# Patient Record
Sex: Male | Born: 1940 | Race: Black or African American | Hispanic: No | Marital: Married | Smoking: Former smoker
Health system: Southern US, Community
[De-identification: ages and names within clinical notes are randomized; demographics above are authoritative.]

## PROBLEM LIST (undated history)

## (undated) DIAGNOSIS — E7800 Pure hypercholesterolemia, unspecified: Secondary | ICD-10-CM

## (undated) DIAGNOSIS — Z8679 Personal history of other diseases of the circulatory system: Secondary | ICD-10-CM

## (undated) DIAGNOSIS — J45909 Unspecified asthma, uncomplicated: Secondary | ICD-10-CM

## (undated) DIAGNOSIS — H409 Unspecified glaucoma: Secondary | ICD-10-CM

## (undated) DIAGNOSIS — E785 Hyperlipidemia, unspecified: Secondary | ICD-10-CM

## (undated) DIAGNOSIS — C61 Malignant neoplasm of prostate: Secondary | ICD-10-CM

## (undated) DIAGNOSIS — R351 Nocturia: Secondary | ICD-10-CM

## (undated) DIAGNOSIS — J449 Chronic obstructive pulmonary disease, unspecified: Secondary | ICD-10-CM

## (undated) DIAGNOSIS — Z973 Presence of spectacles and contact lenses: Secondary | ICD-10-CM

## (undated) DIAGNOSIS — M1990 Unspecified osteoarthritis, unspecified site: Secondary | ICD-10-CM

## (undated) HISTORY — PX: CERVICAL SPINE SURGERY: SHX589

## (undated) HISTORY — PX: PROSTATE BIOPSY: SHX241

## (undated) HISTORY — PX: CATARACT EXTRACTION W/ INTRAOCULAR LENS IMPLANT: SHX1309

## (undated) HISTORY — PX: NECK SURGERY: SHX720

---

## 2005-07-29 ENCOUNTER — Ambulatory Visit: Payer: Self-pay | Admitting: Family Medicine

## 2005-09-25 ENCOUNTER — Ambulatory Visit: Payer: Self-pay | Admitting: Family Medicine

## 2005-10-06 ENCOUNTER — Ambulatory Visit: Payer: Self-pay | Admitting: Family Medicine

## 2006-02-04 DIAGNOSIS — F528 Other sexual dysfunction not due to a substance or known physiological condition: Secondary | ICD-10-CM

## 2006-03-04 ENCOUNTER — Ambulatory Visit: Payer: Self-pay | Admitting: Family Medicine

## 2007-09-04 ENCOUNTER — Emergency Department (HOSPITAL_COMMUNITY): Admission: EM | Admit: 2007-09-04 | Discharge: 2007-09-04 | Payer: Self-pay | Admitting: Emergency Medicine

## 2010-05-24 NOTE — Assessment & Plan Note (Signed)
[facility]                          [place] OFFICE NOTE   NAME:Timothy Evans, Timothy Evans                       MRN:          [id number]  DATE:07/29/2005                            DOB:          1940-07-01    REASON FOR VISIT:  Establish.   HISTORY OF PRESENT ILLNESS:  Mr. Mccluney is a 70 year old male who reports he  has not seen a doctor in 5+ years, presenting today to establish care.  He  does have several concerns.  He complains of daily congestion and sneezing.  He has noticed increased sneezing when he is around dust.  He denies any  fevers, chills, or coughing.  He denies any previous history of allergies.  The patient is a smoker.   The patient is also concerned about smoking.  He states that he smokes 1/4  pack a day.  He states that his breakfast consist of a cup of coffee and  cigarettes.  He would like to improve his diet and is willing to stop  smoking, but feels that he needs help.   Mr. Guile is also traveling through [place] and [place] in the next month and  needs to have his immunizations updated.   Mr. Geister has noticed trouble keeping and getting an erection.  He states  that his sex drive has not changed.  He has never tried any medications for  this symptom.  He denies any trauma or any history of sexually transmitted  disease.  He is in a monogamous relationship with his wife.   PAST MEDICAL HISTORY:  None.   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   MEDICATIONS:  None.   ALLERGIES:  No known drug allergies.   FAMILY HISTORY:  Father was killed in the war in [place].  Mother is alive  with multiple medical problems which the patient is not aware of.  He has 10  brothers and sisters who are alive and well.   SOCIAL HISTORY:  The patient works in the Systems [employer].  He  is married with three children.  He smokes 1/4 pack a day and drinks beer  occasionally.   REVIEW OF SYSTEMS:  Unremarkable except for what is noted in the HPI.   OBJECTIVE:  VITAL SIGNS:  Blood pressure 118/70, pulse of 84, weight of 157.  GENERAL:  A pleasant male in no acute distress.  He answers questions  appropriately.  HEENT:  Nasal mucosa was boggy, swollen turbinates with clear nasal  discharge.  There was cobblestone appearance in the right nostril.  Oropharynx was benign.  NECK:  Supple, no lymphadenopathy, carotid bruits, or JVD.  No thyromegaly  was noted.  LUNGS:  Clear.  HEART:  Regular rate and rhythm, normal S1 and S2, no murmurs, rubs, or  gallops.  EXTREMITIES:  No cyanosis, clubbing, or edema.   IMPRESSION:  1.  Chronic rhinitis.  2.  Erectile dysfunction.  3.  Smoker.   PLAN:  1.  In regards to his rhinitis, we will start the patient on Nasonex two  squirts in each nostril daily and he will follow up in four to six weeks      to let us know if this will help.  2.  In regards to his erectile dysfunction, we will obtain a testosterone      level to rule out a hormone imbalance.  I did provide samples of Cialis,      i.e., five.  Discussed the side effects and directions.  3.  Long discussion regarding smoking cessation was done.  The patient      agreed on treatment.  I provided a prescription for Chantix.  Side      effects were reviewed.  The patient will follow up with me after he      returns from [place].  I also provided him with the 1-800-quit-now card.  4.  In regards to his traveling, I will provide him with the [facility] phone number and address.                                   Leanne Chang, MD   LA/MedQ  DD:  07/29/2005  DT:  07/29/2005  Job #:  [id number]

## 2014-01-16 ENCOUNTER — Emergency Department (HOSPITAL_COMMUNITY)
Admission: EM | Admit: 2014-01-16 | Discharge: 2014-01-16 | Disposition: A | Discharge status: Home / Self Care | Payer: PPO | Attending: Emergency Medicine | Admitting: Emergency Medicine

## 2014-01-16 ENCOUNTER — Encounter (HOSPITAL_COMMUNITY): Payer: Self-pay | Admitting: Emergency Medicine

## 2014-01-16 DIAGNOSIS — E780 Pure hypercholesterolemia: Secondary | ICD-10-CM | POA: Insufficient documentation

## 2014-01-16 DIAGNOSIS — Z79899 Other long term (current) drug therapy: Secondary | ICD-10-CM | POA: Insufficient documentation

## 2014-01-16 DIAGNOSIS — G542 Cervical root disorders, not elsewhere classified: Secondary | ICD-10-CM | POA: Diagnosis not present

## 2014-01-16 DIAGNOSIS — Z87891 Personal history of nicotine dependence: Secondary | ICD-10-CM | POA: Insufficient documentation

## 2014-01-16 DIAGNOSIS — M25511 Pain in right shoulder: Secondary | ICD-10-CM | POA: Diagnosis present

## 2014-01-16 HISTORY — DX: Pure hypercholesterolemia, unspecified: E78.00

## 2014-01-16 MED ORDER — DIAZEPAM 10 MG PO TABS: 10.0000 mg | ORAL_TABLET | Freq: Three times a day (TID) | ORAL | Status: DC | PRN

## 2014-01-16 MED ORDER — PREDNISONE 20 MG PO TABS: 20.0000 mg | ORAL_TABLET | Freq: Two times a day (BID) | ORAL | Status: DC

## 2014-01-16 NOTE — Discharge Instructions (Signed)
Use heat on the sore area 3 or 4 times a day. Take Tylenol every 4 hours as needed for pain.    Cervical Radiculopathy Cervical radiculopathy happens when a nerve in the neck is pinched or bruised by a slipped (herniated) disk or by arthritic changes in the bones of the cervical spine. This can occur due to an injury or as part of the normal aging process. Pressure on the cervical nerves can cause pain or numbness that runs from your neck all the way down into your arm and fingers. CAUSES  There are many possible causes, including:  Injury.  Muscle tightness in the neck from overuse.  Swollen, painful joints (arthritis).  Breakdown or degeneration in the bones and joints of the spine (spondylosis) due to aging.  Bone spurs that may develop near the cervical nerves. SYMPTOMS  Symptoms include pain, weakness, or numbness in the affected arm and hand. Pain can be severe or irritating. Symptoms may be worse when extending or turning the neck. DIAGNOSIS  Your caregiver will ask about your symptoms and do a physical exam. He or she may test your strength and reflexes. X-rays, CT scans, and MRI scans may be needed in cases of injury or if the symptoms do not go away after a period of time. Electromyography (EMG) or nerve conduction testing may be done to study how your nerves and muscles are working. TREATMENT  Your caregiver may recommend certain exercises to help relieve your symptoms. Cervical radiculopathy can, and often does, get better with time and treatment. If your problems continue, treatment options may include:  Wearing a soft collar for short periods of time.  Physical therapy to strengthen the neck muscles.  Medicines, such as nonsteroidal anti-inflammatory drugs (NSAIDs), oral corticosteroids, or spinal injections.  Surgery. Different types of surgery may be done depending on the cause of your problems. HOME CARE INSTRUCTIONS   Put ice on the affected area.  Put ice in a  plastic bag.  Place a towel between your skin and the bag.  Leave the ice on for 15-20 minutes, 03-04 times a day or as directed by your caregiver.  If ice does not help, you can try using heat. Take a warm shower or bath, or use a hot water bottle as directed by your caregiver.  You may try a gentle neck and shoulder massage.  Use a flat pillow when you sleep.  Only take over-the-counter or prescription medicines for pain, discomfort, or fever as directed by your caregiver.  If physical therapy was prescribed, follow your caregiver's directions.  If a soft collar was prescribed, use it as directed. SEEK IMMEDIATE MEDICAL CARE IF:   Your pain gets much worse and cannot be controlled with medicines.  You have weakness or numbness in your hand, arm, face, or leg.  You have a high fever or a stiff, rigid neck.  You lose bowel or bladder control (incontinence).  You have trouble with walking, balance, or speaking. MAKE SURE YOU:   Understand these instructions.  Will watch your condition.  Will get help right away if you are not doing well or get worse. Document Released: 09/17/2000 Document Revised: 03/17/2011 Document Reviewed: 08/06/2010 [facility] Patient Information 2015 [place], [place]. This information is not intended to replace advice given to you by your health care provider. Make sure you discuss any questions you have with your health care provider.

## 2014-01-16 NOTE — ED Notes (Signed)
Pt c/o right shoulder pain that started yesterday afternoon. Pt states that he was at work yesterday when it started hurting but denies any injury. Pt states that he does a lot of walking at work.  Pt states that his mother is up on 6th floor and while he was up there with her he was having a lot of discomfort, se figured he would come be seen.

## 2014-01-16 NOTE — ED Provider Notes (Signed)
CSN: [id number]     Arrival date & time 01/16/14  1033 History   First MD Initiated Contact with Patient 01/16/14 1123     Chief Complaint  Patient presents with  . Shoulder Pain     (Consider location/radiation/quality/duration/timing/severity/associated sxs/prior Treatment) HPI   Timothy Evans is a 74 y.o. male who is here for evaluation of right neck and shoulder pain.  Problem occurred several days ago and is aggravating, bothering him with movement, and when sleeping.  No recent trauma.  History of neck surgery for "pinched nerve, remotely.  He denies fever, chills, nausea, vomiting, weakness or dizziness.  There are no other known modifying factors.   Past Medical History  Diagnosis Date  . High cholesterol    Past Surgical History  Procedure Laterality Date  . Neck surgery     No family history on file. History  Substance Use Topics  . Smoking status: Former Research scientist (life sciences)  . Smokeless tobacco: Not on file  . Alcohol Use: Yes     Comment: occasional    Review of Systems  All other systems reviewed and are negative.     Allergies  Review of patient's allergies indicates no known allergies.  Home Medications   Prior to Admission medications   Medication Sig Start Date End Date Taking? Authorizing Provider  ketoconazole (NIZORAL) 2 % shampoo Apply 1 application topically 2 (two) times a week. 01/09/14  Yes Historical Provider, MD  simvastatin (ZOCOR) 10 MG tablet Take 1 tablet by mouth daily. 01/09/14  Yes Historical Provider, MD  Vitamin D, Ergocalciferol, (DRISDOL) 50000 UNITS CAPS capsule Take 1 capsule by mouth once a week. Every thursday 01/11/14  Yes Historical Provider, MD  diazepam (VALIUM) 10 MG tablet Take 1 tablet (10 mg total) by mouth every 8 (eight) hours as needed (Muscle spasm). 01/16/14   Richarda Blade, MD  predniSONE (DELTASONE) 20 MG tablet Take 1 tablet (20 mg total) by mouth 2 (two) times daily. 01/16/14   Richarda Blade, MD   BP 180/98 mmHg  Pulse 88   Temp(Src) 98.1 F (36.7 C) (Oral)  Resp 16  SpO2 98% Physical Exam  Constitutional: He is oriented to person, place, and time. He appears well-developed and well-nourished. No distress.  HENT:  Head: Normocephalic and atraumatic.  Right Ear: External ear normal.  Left Ear: External ear normal.  Eyes: Conjunctivae and EOM are normal. Pupils are equal, round, and reactive to light.  Neck: Normal range of motion and phonation normal. Neck supple.  Cardiovascular: Normal rate, regular rhythm and normal heart sounds.   Pulmonary/Chest: Effort normal and breath sounds normal. He exhibits no bony tenderness.  Abdominal: Soft. There is no tenderness.  Musculoskeletal: Normal range of motion.  Mild tenderness right trapezius, with normal range of motion, neck, shoulders, arms and legs.  Neurological: He is alert and oriented to person, place, and time. No cranial nerve deficit or sensory deficit. He exhibits normal muscle tone. Coordination normal.  Skin: Skin is warm, dry and intact.  Psychiatric: He has a normal mood and affect. His behavior is normal. Judgment and thought content normal.  Nursing note and vitals reviewed.   ED Course  Procedures (including critical care time)  Findings discussed with patient, all questions answered.  Labs Review Labs Reviewed - No data to display  Imaging Review No results found.   EKG Interpretation None      MDM   Final diagnoses:  Cervical neuropathy    Nursing Notes Reviewed/ Care  Coordinated, and agree without changes. Applicable Imaging Reviewed.  Interpretation of Laboratory Data incorporated into ED treatment  Nursing Notes Reviewed/ Care Coordinated Applicable Imaging Reviewed Interpretation of Laboratory Data incorporated into ED treatment  The patient appears reasonably screened and/or stabilized for discharge and I doubt any other medical condition or other [facility] requiring further screening, evaluation, or treatment in the  ED at this time prior to discharge.  Plan: Home Medications- Valium, Prednisone; Home Treatments- rest, Heat; return here if the recommended treatment, does not improve the symptoms; Recommended follow up- PCP prn     Richarda Blade, MD 01/16/14 1731

## 2014-01-26 ENCOUNTER — Inpatient Hospital Stay (HOSPITAL_COMMUNITY)
Admission: EM | Admit: 2014-01-26 | Discharge: 2014-01-29 | DRG: 194 | Disposition: A | Discharge status: Home / Self Care | Payer: PPO | Attending: Internal Medicine | Admitting: Internal Medicine

## 2014-01-26 ENCOUNTER — Encounter (HOSPITAL_COMMUNITY): Payer: Self-pay

## 2014-01-26 ENCOUNTER — Emergency Department (HOSPITAL_COMMUNITY): Payer: PPO

## 2014-01-26 DIAGNOSIS — J154 Pneumonia due to other streptococci: Principal | ICD-10-CM | POA: Diagnosis present

## 2014-01-26 DIAGNOSIS — R05 Cough: Secondary | ICD-10-CM | POA: Diagnosis present

## 2014-01-26 DIAGNOSIS — Z87891 Personal history of nicotine dependence: Secondary | ICD-10-CM

## 2014-01-26 DIAGNOSIS — N179 Acute kidney failure, unspecified: Secondary | ICD-10-CM | POA: Diagnosis present

## 2014-01-26 DIAGNOSIS — E785 Hyperlipidemia, unspecified: Secondary | ICD-10-CM | POA: Diagnosis present

## 2014-01-26 DIAGNOSIS — M779 Enthesopathy, unspecified: Secondary | ICD-10-CM | POA: Diagnosis present

## 2014-01-26 DIAGNOSIS — M19011 Primary osteoarthritis, right shoulder: Secondary | ICD-10-CM | POA: Diagnosis present

## 2014-01-26 DIAGNOSIS — R059 Cough, unspecified: Secondary | ICD-10-CM | POA: Insufficient documentation

## 2014-01-26 DIAGNOSIS — M25511 Pain in right shoulder: Secondary | ICD-10-CM

## 2014-01-26 DIAGNOSIS — R079 Chest pain, unspecified: Secondary | ICD-10-CM

## 2014-01-26 DIAGNOSIS — N289 Disorder of kidney and ureter, unspecified: Secondary | ICD-10-CM

## 2014-01-26 DIAGNOSIS — J189 Pneumonia, unspecified organism: Secondary | ICD-10-CM | POA: Diagnosis present

## 2014-01-26 LAB — CBC WITH DIFFERENTIAL/PLATELET
BASOS ABS: 0 10*3/uL (ref 0.0–0.1)
BASOS PCT: 0 % (ref 0–1)
EOS PCT: 0 % (ref 0–5)
Eosinophils Absolute: 0 10*3/uL (ref 0.0–0.7)
HEMATOCRIT: 48 % (ref 39.0–52.0)
Hemoglobin: 17.1 g/dL — ABNORMAL HIGH (ref 13.0–17.0)
LYMPHS ABS: 0.7 10*3/uL (ref 0.7–4.0)
Lymphocytes Relative: 4 % — ABNORMAL LOW (ref 12–46)
MCH: 31.5 pg (ref 26.0–34.0)
MCHC: 35.6 g/dL (ref 30.0–36.0)
MCV: 88.4 fL (ref 78.0–100.0)
MONO ABS: 0.9 10*3/uL (ref 0.1–1.0)
MONOS PCT: 5 % (ref 3–12)
Neutro Abs: 14.8 10*3/uL — ABNORMAL HIGH (ref 1.7–7.7)
Neutrophils Relative %: 91 % — ABNORMAL HIGH (ref 43–77)
Platelets: 243 10*3/uL (ref 150–400)
RBC: 5.43 MIL/uL (ref 4.22–5.81)
RDW: 14.5 % (ref 11.5–15.5)
WBC: 16.4 10*3/uL — ABNORMAL HIGH (ref 4.0–10.5)

## 2014-01-26 LAB — COMPREHENSIVE METABOLIC PANEL
ALBUMIN: 3.5 g/dL (ref 3.5–5.2)
ALK PHOS: 62 U/L (ref 39–117)
ALT: 37 U/L (ref 0–53)
AST: 46 U/L — ABNORMAL HIGH (ref 0–37)
Anion gap: 14 (ref 5–15)
BILIRUBIN TOTAL: 2.4 mg/dL — AB (ref 0.3–1.2)
BUN: 36 mg/dL — ABNORMAL HIGH (ref 6–23)
CALCIUM: 8.7 mg/dL (ref 8.4–10.5)
CO2: 19 mmol/L (ref 19–32)
CREATININE: 1.76 mg/dL — AB (ref 0.50–1.35)
Chloride: 104 mEq/L (ref 96–112)
GFR calc non Af Amer: 37 mL/min — ABNORMAL LOW (ref >90)
GFR, EST AFRICAN AMERICAN: 43 mL/min — AB (ref >90)
Glucose, Bld: 145 mg/dL — ABNORMAL HIGH (ref 70–99)
POTASSIUM: 4.1 mmol/L (ref 3.5–5.1)
SODIUM: 137 mmol/L (ref 135–145)
TOTAL PROTEIN: 8.3 g/dL (ref 6.0–8.3)

## 2014-01-26 LAB — I-STAT TROPONIN, ED: Troponin i, poc: 0 ng/mL (ref 0.00–0.08)

## 2014-01-26 LAB — I-STAT CG4 LACTIC ACID, ED: Lactic Acid, Venous: 1.78 mmol/L (ref 0.5–2.0)

## 2014-01-26 MED ORDER — AZITHROMYCIN 250 MG PO TABS
500.0000 mg | ORAL_TABLET | Freq: Once | ORAL | Status: AC
  Administered 2014-01-26: 500 mg via ORAL
  Filled 2014-01-26: qty 2

## 2014-01-26 MED ORDER — SODIUM CHLORIDE 0.9 % IV BOLUS (SEPSIS)
1000.0000 mL | Freq: Once | INTRAVENOUS | Status: AC
  Administered 2014-01-26: 1000 mL via INTRAVENOUS

## 2014-01-26 MED ORDER — DEXTROSE 5 % IV SOLN
1.0000 g | Freq: Once | INTRAVENOUS | Status: AC
  Administered 2014-01-26: 1 g via INTRAVENOUS
  Filled 2014-01-26: qty 10

## 2014-01-26 NOTE — ED Notes (Signed)
EKG given to EDP,Knapp,J. For review. 

## 2014-01-26 NOTE — ED Provider Notes (Signed)
CSN: [id number]     Arrival date & time 01/26/14  1932 History   First MD Initiated Contact with Patient 01/26/14 1944     Chief Complaint  Patient presents with  . Weakness  . Cough     (Consider location/radiation/quality/duration/timing/severity/associated sxs/prior Treatment) Patient is a 74 y.o. male presenting with cough. The history is provided by the patient.  Cough Cough characteristics:  Productive Sputum characteristics:  Yellow Severity:  Moderate Onset quality:  Gradual Duration:  5 days Timing:  Constant Progression:  Worsening Chronicity:  New Smoker: no   Context: sick contacts   Context comment:  10 days ago his mother was admitted for pneumonia Relieved by:  Nothing Worsened by:  Nothing tried Ineffective treatments:  None tried Associated symptoms: shortness of breath   Associated symptoms: no chest pain, no fever, no sore throat and no wheezing   Associated symptoms comment:  10 days severe right shoulder pain that's getting worse.  Patient has had decreased appetite and has not eaten in 4 days Risk factors: no recent infection     Past Medical History  Diagnosis Date  . High cholesterol    Past Surgical History  Procedure Laterality Date  . Neck surgery     History reviewed. No pertinent family history. History  Substance Use Topics  . Smoking status: Former Research scientist (life sciences)  . Smokeless tobacco: Not on file  . Alcohol Use: Yes     Comment: occasional    Review of Systems  Constitutional: Negative for fever.  HENT: Negative for sore throat.   Respiratory: Positive for cough and shortness of breath. Negative for wheezing.   Cardiovascular: Negative for chest pain.  All other systems reviewed and are negative.     Allergies  Review of patient's allergies indicates no known allergies.  Home Medications   Prior to Admission medications   Medication Sig Start Date End Date Taking? Authorizing Provider  diazepam (VALIUM) 10 MG tablet Take 1  tablet (10 mg total) by mouth every 8 (eight) hours as needed (Muscle spasm). 01/16/14   Richarda Blade, MD  ketoconazole (NIZORAL) 2 % shampoo Apply 1 application topically 2 (two) times a week. 01/09/14   Historical Provider, MD  predniSONE (DELTASONE) 20 MG tablet Take 1 tablet (20 mg total) by mouth 2 (two) times daily. 01/16/14   Richarda Blade, MD  simvastatin (ZOCOR) 10 MG tablet Take 1 tablet by mouth daily. 01/09/14   Historical Provider, MD  Vitamin D, Ergocalciferol, (DRISDOL) 50000 UNITS CAPS capsule Take 1 capsule by mouth once a week. Every thursday 01/11/14   Historical Provider, MD   BP 105/64 mmHg  Pulse 118  Temp(Src) 97.9 F (36.6 C) (Oral)  Resp 18  Ht 5\' 9"  (1.753 m)  Wt 179 lb (81.194 kg)  BMI 26.42 kg/m2  SpO2 97% Physical Exam  Constitutional: He is oriented to person, place, and time. He appears well-developed and well-nourished. No distress.  HENT:  Head: Normocephalic and atraumatic.  Mouth/Throat: Oropharynx is clear and moist. Mucous membranes are dry.  Eyes: Conjunctivae and EOM are normal. Pupils are equal, round, and reactive to light.  Neck: Normal range of motion. Neck supple.  Cardiovascular: Regular rhythm and intact distal pulses.  Tachycardia present.   No murmur heard. Pulmonary/Chest: Effort normal and breath sounds normal. Tachypnea noted. No respiratory distress. He has no wheezes. He has no rales.  Abdominal: Soft. He exhibits no distension. There is no tenderness. There is no rebound and no guarding.  Musculoskeletal:  Normal range of motion. He exhibits no edema or tenderness.  Neurological: He is alert and oriented to person, place, and time.  Skin: Skin is warm and dry. No rash noted. No erythema.  Psychiatric: He has a normal mood and affect. His behavior is normal.  Nursing note and vitals reviewed.   ED Course  Procedures (including critical care time) Labs Review Labs Reviewed  CBC WITH DIFFERENTIAL - Abnormal; Notable for the  following:    WBC 16.4 (*)    Hemoglobin 17.1 (*)    Neutrophils Relative % 91 (*)    Neutro Abs 14.8 (*)    Lymphocytes Relative 4 (*)    All other components within normal limits  COMPREHENSIVE METABOLIC PANEL - Abnormal; Notable for the following:    Glucose, Bld 145 (*)    BUN 36 (*)    Creatinine, Ser 1.76 (*)    AST 46 (*)    Total Bilirubin 2.4 (*)    GFR calc non Af Amer 37 (*)    GFR calc Af Amer 43 (*)    All other components within normal limits  I-STAT TROPOININ, ED  I-STAT CG4 LACTIC ACID, ED    Imaging Review Dg Chest 2 View  01/26/2014   CLINICAL DATA:  Pt complains of being weak, not eating in 4 days, and a productive cough. Pt also states that his right arm is very weak.  EXAM: CHEST  2 VIEW  COMPARISON:  None  FINDINGS: There is right upper lobe consolidation consistent with pneumonia. Lungs are mildly hyperexpanded. No pleural effusion or pneumothorax.  Heart, mediastinum and hila are unremarkable.  Bony thorax is demineralized but intact.  IMPRESSION: Right upper lobe pneumonia.a   Electronically Signed   By: Lajean Manes M.D.   On: 01/26/2014 20:05     EKG Interpretation   Date/Time:  Thursday January 26 2014 19:50:58 EST Ventricular Rate:  113 PR Interval:  110 QRS Duration: 83 QT Interval:  281 QTC Calculation: 385 R Axis:   38 Text Interpretation:  Sinus tachycardia Probable left atrial enlargement  RSR' in V1 or V2, probably normal variant Borderline repol abnormality,  diffuse leads No previous tracing Confirmed by Maryan Rued  MD, Loree Fee  [phone]) on 01/26/2014 7:58:45 PM      MDM   Final diagnoses:  CAP (community acquired pneumonia)  Acute renal insufficiency    Patient presents with 5 days of worsening cough, decreased appetite and mild shortness of breath. His mother was recently admitted for pneumonia 10 days ago and his symptoms developed 5 days ago.  Patient has also been complaining of right shoulder pain for the last 10 days. He denies  any unilateral leg pain or swelling.  Here patient is tachycardic but does not appear to be tachypnea get this time. He satting 97% on room air. No prior history of asthma.  Chest x-ray shows right upper lobe pneumonia. CBC, CMP, lactate, troponin pending. Patient given IV fluids  10:32 PM Pt with leukocytosis of 16,000 and CMP with new ARI.  Pt given azithro/rocephin and IVF.  Blanchie Dessert, MD 01/26/14 2235

## 2014-01-26 NOTE — ED Notes (Signed)
Pt complains of being weak, not eating in 4 days, and a cough. Pt's mother admitted here for 10days for pneumonia, pt went to [facility] and upon his return developed this cough and weakness, pt also states that his right arm is very weak

## 2014-01-27 ENCOUNTER — Inpatient Hospital Stay (HOSPITAL_COMMUNITY): Payer: PPO

## 2014-01-27 DIAGNOSIS — N289 Disorder of kidney and ureter, unspecified: Secondary | ICD-10-CM

## 2014-01-27 DIAGNOSIS — J189 Pneumonia, unspecified organism: Secondary | ICD-10-CM

## 2014-01-27 DIAGNOSIS — R079 Chest pain, unspecified: Secondary | ICD-10-CM | POA: Insufficient documentation

## 2014-01-27 DIAGNOSIS — E785 Hyperlipidemia, unspecified: Secondary | ICD-10-CM

## 2014-01-27 DIAGNOSIS — M25511 Pain in right shoulder: Secondary | ICD-10-CM

## 2014-01-27 LAB — CBC WITH DIFFERENTIAL/PLATELET
Basophils Absolute: 0 10*3/uL (ref 0.0–0.1)
Basophils Relative: 0 % (ref 0–1)
EOS ABS: 0.1 10*3/uL (ref 0.0–0.7)
EOS PCT: 0 % (ref 0–5)
HEMATOCRIT: 42 % (ref 39.0–52.0)
HEMOGLOBIN: 14.8 g/dL (ref 13.0–17.0)
LYMPHS ABS: 1.7 10*3/uL (ref 0.7–4.0)
Lymphocytes Relative: 10 % — ABNORMAL LOW (ref 12–46)
MCH: 31.2 pg (ref 26.0–34.0)
MCHC: 35.2 g/dL (ref 30.0–36.0)
MCV: 88.4 fL (ref 78.0–100.0)
Monocytes Absolute: 0.9 10*3/uL (ref 0.1–1.0)
Monocytes Relative: 6 % (ref 3–12)
Neutro Abs: 14.3 10*3/uL — ABNORMAL HIGH (ref 1.7–7.7)
Neutrophils Relative %: 84 % — ABNORMAL HIGH (ref 43–77)
Platelets: 247 10*3/uL (ref 150–400)
RBC: 4.75 MIL/uL (ref 4.22–5.81)
RDW: 14.5 % (ref 11.5–15.5)
WBC: 17 10*3/uL — AB (ref 4.0–10.5)

## 2014-01-27 LAB — EXPECTORATED SPUTUM ASSESSMENT W REFEX TO RESP CULTURE

## 2014-01-27 LAB — URINE MICROSCOPIC-ADD ON

## 2014-01-27 LAB — COMPREHENSIVE METABOLIC PANEL
ALK PHOS: 59 U/L (ref 39–117)
ALT: 45 U/L (ref 0–53)
ANION GAP: 9 (ref 5–15)
AST: 64 U/L — AB (ref 0–37)
Albumin: 3 g/dL — ABNORMAL LOW (ref 3.5–5.2)
BUN: 26 mg/dL — ABNORMAL HIGH (ref 6–23)
CO2: 22 mmol/L (ref 19–32)
Calcium: 8 mg/dL — ABNORMAL LOW (ref 8.4–10.5)
Chloride: 107 mEq/L (ref 96–112)
Creatinine, Ser: 1.22 mg/dL (ref 0.50–1.35)
GFR calc non Af Amer: 58 mL/min — ABNORMAL LOW (ref >90)
GFR, EST AFRICAN AMERICAN: 67 mL/min — AB (ref >90)
GLUCOSE: 100 mg/dL — AB (ref 70–99)
Potassium: 3.9 mmol/L (ref 3.5–5.1)
Sodium: 138 mmol/L (ref 135–145)
TOTAL PROTEIN: 6.8 g/dL (ref 6.0–8.3)
Total Bilirubin: 2 mg/dL — ABNORMAL HIGH (ref 0.3–1.2)

## 2014-01-27 LAB — URINALYSIS, ROUTINE W REFLEX MICROSCOPIC
BILIRUBIN URINE: NEGATIVE
GLUCOSE, UA: NEGATIVE mg/dL
KETONES UR: NEGATIVE mg/dL
Leukocytes, UA: NEGATIVE
Nitrite: NEGATIVE
PROTEIN: NEGATIVE mg/dL
Specific Gravity, Urine: >1.046 — ABNORMAL HIGH (ref 1.005–1.030)
pH: 5.5 (ref 5.0–8.0)

## 2014-01-27 LAB — INFLUENZA PANEL BY PCR (TYPE A & B)
H1N1 flu by pcr: NOT DETECTED
Influenza A By PCR: NEGATIVE
Influenza B By PCR: NEGATIVE

## 2014-01-27 LAB — D-DIMER, QUANTITATIVE: D-Dimer, Quant: 2.03 ug/mL-FEU — ABNORMAL HIGH (ref 0.00–0.48)

## 2014-01-27 LAB — EXPECTORATED SPUTUM ASSESSMENT W GRAM STAIN, RFLX TO RESP C

## 2014-01-27 LAB — STREP PNEUMONIAE URINARY ANTIGEN: Strep Pneumo Urinary Antigen: POSITIVE — AB

## 2014-01-27 MED ORDER — KETOCONAZOLE 2 % EX SHAM
1.0000 "application " | MEDICATED_SHAMPOO | CUTANEOUS | Status: DC
  Filled 2014-01-27: qty 120

## 2014-01-27 MED ORDER — METHOCARBAMOL 500 MG PO TABS
500.0000 mg | ORAL_TABLET | Freq: Three times a day (TID) | ORAL | Status: DC | PRN
  Administered 2014-01-28: 500 mg via ORAL
  Filled 2014-01-27: qty 1

## 2014-01-27 MED ORDER — ENOXAPARIN SODIUM 40 MG/0.4ML ~~LOC~~ SOLN
40.0000 mg | SUBCUTANEOUS | Status: DC
  Administered 2014-01-27 – 2014-01-29 (×3): 40 mg via SUBCUTANEOUS
  Filled 2014-01-27 (×3): qty 0.4

## 2014-01-27 MED ORDER — TRAMADOL HCL 50 MG PO TABS
50.0000 mg | ORAL_TABLET | Freq: Four times a day (QID) | ORAL | Status: DC | PRN
  Administered 2014-01-27 – 2014-01-28 (×2): 50 mg via ORAL
  Filled 2014-01-27 (×2): qty 1

## 2014-01-27 MED ORDER — MORPHINE SULFATE 2 MG/ML IJ SOLN
1.0000 mg | INTRAMUSCULAR | Status: DC | PRN
  Administered 2014-01-27: 1 mg via INTRAVENOUS
  Filled 2014-01-27: qty 1

## 2014-01-27 MED ORDER — SODIUM CHLORIDE 0.9 % IV SOLN
INTRAVENOUS | Status: AC
  Administered 2014-01-27: 75 mL/h via INTRAVENOUS
  Administered 2014-01-27: 13:00:00 via INTRAVENOUS

## 2014-01-27 MED ORDER — DEXTROSE 5 % IV SOLN
500.0000 mg | INTRAVENOUS | Status: DC
  Administered 2014-01-27 – 2014-01-28 (×2): 500 mg via INTRAVENOUS
  Filled 2014-01-27 (×3): qty 500

## 2014-01-27 MED ORDER — IOHEXOL 350 MG/ML SOLN
100.0000 mL | Freq: Once | INTRAVENOUS | Status: AC | PRN
  Administered 2014-01-27: 100 mL via INTRAVENOUS

## 2014-01-27 MED ORDER — GUAIFENESIN ER 600 MG PO TB12
600.0000 mg | ORAL_TABLET | Freq: Two times a day (BID) | ORAL | Status: DC
  Administered 2014-01-27 – 2014-01-29 (×5): 600 mg via ORAL
  Filled 2014-01-27 (×6): qty 1

## 2014-01-27 MED ORDER — SIMVASTATIN 10 MG PO TABS
10.0000 mg | ORAL_TABLET | Freq: Every day | ORAL | Status: DC
  Administered 2014-01-27 – 2014-01-28 (×2): 10 mg via ORAL
  Filled 2014-01-27 (×3): qty 1

## 2014-01-27 MED ORDER — CEFTRIAXONE SODIUM IN DEXTROSE 20 MG/ML IV SOLN
1.0000 g | INTRAVENOUS | Status: DC
  Administered 2014-01-27 – 2014-01-28 (×2): 1 g via INTRAVENOUS
  Filled 2014-01-27 (×3): qty 50

## 2014-01-27 MED ORDER — BUDESONIDE 0.25 MG/2ML IN SUSP
0.2500 mg | Freq: Two times a day (BID) | RESPIRATORY_TRACT | Status: DC
  Administered 2014-01-27 – 2014-01-29 (×4): 0.25 mg via RESPIRATORY_TRACT
  Filled 2014-01-27 (×5): qty 2

## 2014-01-27 MED ORDER — DIAZEPAM 5 MG PO TABS: 10.0000 mg | ORAL_TABLET | Freq: Three times a day (TID) | ORAL | Status: DC | PRN

## 2014-01-27 NOTE — Progress Notes (Signed)
Patient seen and examined. Admitted after midnight secondary to CP, SOB and productive cough. Found to have elevated D-dimer, ARF and PNA. Please referred to H&P written by Dr. Hal Hope for further info/details on admission.  Plan: -CTA neg for PE; demonstarting bilateral PNA -continue rocephin and zithromax -will start mucinex, flutter valve and pulmicort -no rotator cuff rupture on his right shoulder. Continue PRN analgesics and PRN robaxin   Barton Dubois [phone]

## 2014-01-27 NOTE — H&P (Signed)
[facility] History and Physical  Timothy Evans HFW:[id number] DOB: 02/09/1940 DOA: 01/26/2014  Referring physician: ER physician. PCP: No primary care provider on file. Dr. Jackson Latino.  Chief Complaint: Cough and shortness of breath.  HPI: Timothy Evans is a 74 y.o. male with history of hyperlipidemia presents to the ER because of increasing cough with mild shortness of breath and pleuritic-type of chest pain. Patient states his family member was recently diagnosed with pneumonia 10 days ago. Patient started having the symptoms 5 days ago with persistent productive cough and some pleuritic type chest pain with some shortness of breath. Patient also has been experiencing right shoulder pain over the last 10 days which increases on abduction. Denies any trauma or fall. In the ER chest x-ray shows right upper lobe pneumonia. Patient was initially tachycardic and febrile with labs showing acute renal failure (no old labs to compare). Patient has been admitted for further management of pneumonia. Patient has not received travel and denies any night sweats or weight loss. Patient quit smoking almost 10 years ago.   Review of Systems: As presented in the history of presenting illness, rest negative.  Past Medical History  Diagnosis Date  . High cholesterol    Past Surgical History  Procedure Laterality Date  . Neck surgery     Social History:  reports that he has quit smoking. He does not have any smokeless tobacco history on file. He reports that he drinks alcohol. His drug history is not on file. Where does patient live at home. Can patient participate in ADLs? Yes.  No Known Allergies  Family History:  Family History  Problem Relation Age of Onset  . Diabetes Mellitus II Neg Hx       Prior to Admission medications   Medication Sig Start Date End Date Taking? Authorizing Provider  diazepam (VALIUM) 10 MG tablet Take 1 tablet (10 mg total) by mouth every 8 (eight) hours as needed  (Muscle spasm). 01/16/14  Yes Richarda Blade, MD  ketoconazole (NIZORAL) 2 % shampoo Apply 1 application topically 2 (two) times a week. 01/09/14  Yes Historical Provider, MD  simvastatin (ZOCOR) 10 MG tablet Take 1 tablet by mouth daily. 01/09/14  Yes Historical Provider, MD  Vitamin D, Ergocalciferol, (DRISDOL) 50000 UNITS CAPS capsule Take 1 capsule by mouth once a week. Every thursday 01/11/14  Yes Historical Provider, MD  predniSONE (DELTASONE) 20 MG tablet Take 1 tablet (20 mg total) by mouth 2 (two) times daily. Patient not taking: Reported on 01/26/2014 01/16/14   Richarda Blade, MD    Physical Exam: Filed Vitals:   01/26/14 2215 01/26/14 2230 01/26/14 2245 01/26/14 2300  BP: 107/70 119/85 120/71 126/73  Pulse: 97 98 94 91  Temp:      TempSrc:      Resp: 14 19 17 15   Height:      Weight:      SpO2: 93% 92% 96% 95%     General:  Moderately built and nourished.  Eyes: Anicteric no pallor.  ENT: No discharge from the ears eyes nose or mouth.  Neck: No mass felt.  Cardiovascular: S1 and S2 heard.  Respiratory: No rhonchi or crepitations.  Abdomen: Soft nontender bowel sounds present.  Skin: No rash.  Musculoskeletal: Pain on abducting the right shoulder above 90.  Psychiatric: Appears normal.  Neurologic: Alert awake oriented to time place and person. Moves all extremities.  Labs on Admission:  Basic Metabolic Panel:  Recent Labs Lab 01/26/14 2026  NA 137  K 4.1  CL 104  CO2 19  GLUCOSE 145*  BUN 36*  CREATININE 1.76*  CALCIUM 8.7   Liver Function Tests:  Recent Labs Lab 01/26/14 2026  AST 46*  ALT 37  ALKPHOS 62  BILITOT 2.4*  PROT 8.3  ALBUMIN 3.5   No results for input(s): LIPASE, AMYLASE in the last 168 hours. No results for input(s): AMMONIA in the last 168 hours. CBC:  Recent Labs Lab 01/26/14 2026  WBC 16.4*  NEUTROABS 14.8*  HGB 17.1*  HCT 48.0  MCV 88.4  PLT 243   Cardiac Enzymes: No results for input(s): CKTOTAL, CKMB,  CKMBINDEX, TROPONINI in the last 168 hours.  BNP (last 3 results) No results for input(s): PROBNP in the last 8760 hours. CBG: No results for input(s): GLUCAP in the last 168 hours.  Radiological Exams on Admission: Dg Chest 2 View  01/26/2014   CLINICAL DATA:  Pt complains of being weak, not eating in 4 days, and a productive cough. Pt also states that his right arm is very weak.  EXAM: CHEST  2 VIEW  COMPARISON:  None  FINDINGS: There is right upper lobe consolidation consistent with pneumonia. Lungs are mildly hyperexpanded. No pleural effusion or pneumothorax.  Heart, mediastinum and hila are unremarkable.  Bony thorax is demineralized but intact.  IMPRESSION: Right upper lobe pneumonia.a   Electronically Signed   By: Lajean Manes M.D.   On: 01/26/2014 20:05    EKG: Independently reviewed. Sinus tachycardia with borderline repolarization changes.  Assessment/Plan Active Problems:   Pneumonia   Right shoulder pain   Hyperlipidemia   Acute renal insufficiency   CAP (community acquired pneumonia)   1. Pneumonia - patient has been placed on ceftriaxone and Zithromax for community acquired pneumonia. Check influenza PCR, urine for Legionella and strep antigen and HIV status. Since patient complains of pleuritic chest pain check d-dimer and troponin. 2. Right shoulder pain - patient states he has significant pain on abducting his arm which is new over the last 10 days denies any trauma. Check MRI right shoulder. 3. Acute renal failure - no old labs to compare. Check urine studies. Closely follow metabolic panel. Patient did receive IV fluids. 4. Hyperlipidemia - continue home medications. 5. Leukocytosis - probably from #1.   Given the history of cigarette smoking I have advised patient that he will need repeat chest x-ray within 4-6 weeks to confirm the resolution of pneumonia.  DVT Prophylaxis Lovenox.  Code Status: Full code.  Family Communication: None.  Disposition Plan: Admit  to inpatient.    Elyza Whitt N. [facility] Pager [phone].  If 7PM-7AM, please contact night-coverage [email or web address] Password [facility] 01/27/2014, 12:06 AM

## 2014-01-28 LAB — CBC
HCT: 40.5 % (ref 39.0–52.0)
HEMOGLOBIN: 14.1 g/dL (ref 13.0–17.0)
MCH: 31 pg (ref 26.0–34.0)
MCHC: 34.8 g/dL (ref 30.0–36.0)
MCV: 89 fL (ref 78.0–100.0)
Platelets: 265 10*3/uL (ref 150–400)
RBC: 4.55 MIL/uL (ref 4.22–5.81)
RDW: 14.6 % (ref 11.5–15.5)
WBC: 12.6 10*3/uL — ABNORMAL HIGH (ref 4.0–10.5)

## 2014-01-28 LAB — BASIC METABOLIC PANEL
ANION GAP: 7 (ref 5–15)
BUN: 15 mg/dL (ref 6–23)
CO2: 23 mmol/L (ref 19–32)
CREATININE: 1.11 mg/dL (ref 0.50–1.35)
Calcium: 7.9 mg/dL — ABNORMAL LOW (ref 8.4–10.5)
Chloride: 106 mmol/L (ref 96–112)
GFR calc Af Amer: 75 mL/min — ABNORMAL LOW (ref >90)
GFR, EST NON AFRICAN AMERICAN: 65 mL/min — AB (ref >90)
GLUCOSE: 88 mg/dL (ref 70–99)
POTASSIUM: 3.6 mmol/L (ref 3.5–5.1)
Sodium: 136 mmol/L (ref 135–145)

## 2014-01-28 LAB — HIV ANTIBODY (ROUTINE TESTING W REFLEX)
HIV 1/O/2 Abs-Index Value: <1 (ref <1.00)
HIV-1/HIV-2 Ab: NONREACTIVE

## 2014-01-28 MED ORDER — SODIUM CHLORIDE 0.9 % IV SOLN
INTRAVENOUS | Status: DC
  Administered 2014-01-28: 15:00:00 via INTRAVENOUS

## 2014-01-28 MED ORDER — INDOMETHACIN 50 MG PO CAPS
50.0000 mg | ORAL_CAPSULE | Freq: Two times a day (BID) | ORAL | Status: AC
  Administered 2014-01-28 – 2014-01-29 (×2): 50 mg via ORAL
  Filled 2014-01-28 (×2): qty 1

## 2014-01-28 NOTE — Progress Notes (Signed)
[facility] PROGRESS NOTE  Timothy Evans ZCH:[id number] DOB: 04-14-1940 DOA: 01/26/2014 PCP: No primary care provider on file.  Assessment/Plan: 1-SIRS/early sepsis due to CAP: improving and better. 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   Consultants:  None   Procedures:  See below for x-ray reports   Antibiotics:  Rocephin   zithromax   HPI/Subjective: No feevr. Breathing better, denies CP. Still complaining of shoulder pain.  Objective: Filed Vitals:   01/28/14 0603  BP: 112/64  Pulse: 92  Temp: 98.7 F (37.1 C)  Resp: 17    Intake/Output Summary (Last 24 hours) at 01/28/14 1455 Last data filed at 01/28/14 0900  Gross per 24 hour  Intake    100 ml  Output      0 ml  Net    100 ml   Filed Weights   01/26/14 1939 01/27/14 0007  Weight: 81.194 kg (179 lb) 70.761 kg (156 lb)    Exam:   General:  Afebrile, reports no further CP; still with some cough and complaining of right shoulder pain  Cardiovascular: S1 and S2, no rubs or gallops  Respiratory: scattered rhonchi, no wheezing   Abdomen: soft, NT, ND, positive BS  Musculoskeletal: no edema, no cyanosis or clubbing. Limited range of motion on his right shoulder and complaining of pain.  Data Reviewed: Basic Metabolic Panel:  Recent Labs Lab 01/26/14 2026 01/27/14 0500 01/28/14 0510  NA 137 138 136  K 4.1 3.9 3.6  CL 104 107 106  CO2 19 22 23   GLUCOSE 145*  100* 88  BUN 36* 26* 15  CREATININE 1.76* 1.22 1.11  CALCIUM 8.7 8.0* 7.9*   Liver Function Tests:  Recent Labs Lab 01/26/14 2026 01/27/14 0500  AST 46* 64*  ALT 37 45  ALKPHOS 62 59  BILITOT 2.4* 2.0*  PROT 8.3 6.8  ALBUMIN 3.5 3.0*   CBC:  Recent Labs Lab 01/26/14 2026 01/27/14 0500 01/28/14 0510  WBC 16.4* 17.0* 12.6*  NEUTROABS 14.8* 14.3*  --   HGB 17.1* 14.8 14.1  HCT 48.0 42.0 40.5  MCV 88.4 88.4 89.0  PLT 243 247 265    Recent Results (from the past 240 hour(s))  Culture, sputum-assessment     Status: None   Collection Time: 01/27/14 11:18 AM  Result Value Ref Range Status   Specimen Description SPUTUM  Final   Special Requests NONE  Final   Sputum evaluation   Final    THIS SPECIMEN IS ACCEPTABLE. RESPIRATORY CULTURE REPORT TO FOLLOW.   Report Status 01/27/2014 FINAL  Final  Culture, respiratory (NON-Expectorated)     Status: None (Preliminary result)   Collection Time: 01/27/14 11:18 AM  Result Value Ref Range Status   Specimen Description SPUTUM  Final   Special Requests NONE  Final   Gram Stain PENDING  Incomplete   Culture   Final    Culture reincubated for better growth Performed at [facility]    Report Status PENDING  Incomplete     Studies: Dg Chest 2 View  01/26/2014   CLINICAL DATA:  Pt complains of being weak, not eating in 4 days, and a productive cough. Pt also states that his right arm is very weak.  EXAM: CHEST  2 VIEW  COMPARISON:  None  FINDINGS: There is right upper lobe consolidation consistent with pneumonia. Lungs are mildly hyperexpanded. No pleural effusion or pneumothorax.  Heart, mediastinum and hila are unremarkable.  Bony thorax is demineralized but intact.  IMPRESSION: Right upper lobe pneumonia.a   Electronically Signed   By: Lajean Manes M.D.   On: 01/26/2014 20:05   Ct Angio Chest Pe W/cm &/or Wo Cm  01/27/2014   CLINICAL DATA:  Pneumonia with increasing cough, shortness of breath and pleuritic chest pain.   EXAM: CT ANGIOGRAPHY CHEST WITH CONTRAST  TECHNIQUE: Multidetector CT imaging of the chest was performed using the standard protocol during bolus administration of intravenous contrast. Multiplanar CT image reconstructions and MIPs were obtained to evaluate the vascular anatomy.  CONTRAST:  157mL OMNIPAQUE IOHEXOL 350 MG/ML SOLN  COMPARISON:  Chest x-ray on 01/26/2014  FINDINGS: Dominant posterior right upper lobe infiltrate present. Milder patchy infiltrate present in the left upper lobe. There is a region of rounded appearing infiltrate versus atelectasis in the posterior left lower lobe. No associated pleural or pericardial fluid. Mildly prominent mediastinal nodes at the level of the AP window. The largest measures 14 mm in short axis.  Pulmonary arteries are well opacified and there is no evidence of pulmonary embolism. The thoracic aorta is normal in caliber. No airway obstruction identified. The thyroid gland appears unremarkable by CT. No bony abnormalities. Visualized upper abdomen is unremarkable.  Review of the MIP images confirms the above findings.  IMPRESSION: 1. Bilateral pneumonia, predominantly in the right upper lobe but also in the left upper lobe. There is also an area of rounded infiltrate versus atelectasis at the posterior left lung base. No associated pleural fluid. 2. No evidence of pulmonary embolism.   Electronically Signed   By: Aletta Edouard M.D.   On: 01/27/2014 09:33   Mr Shoulder Right Wo Contrast  01/27/2014   CLINICAL DATA:  Right shoulder pain.  Bilateral pneumonia.  EXAM: MRI OF THE RIGHT SHOULDER WITHOUT CONTRAST  TECHNIQUE: Multiplanar, multisequence MR imaging of the shoulder was performed. No intravenous contrast was administered.  COMPARISON:  01/27/2014  FINDINGS: Despite efforts by the technologist and patient, motion artifact is present on today's exam and could not be eliminated. This reduces exam sensitivity and specificity.  Rotator cuff:  Mild subscapularis and  infraspinatus tendinopathy.  Muscles:  Unremarkable  Biceps long head:  Unremarkable  Acromioclavicular Joint: Moderate degenerative AC joint arthropathy with adjacent marrow edema and mild spurring. The acromial undersurface is type 3 (hooked).  Glenohumeral Joint: Mild degenerative chondral thinning. Minimal spurring of the right humeral head. Coracohumeral ligament intact, no definite synovitis along the rotator interval or inferior glenohumeral ligament. No joint effusion.  Labrum:  Grossly unremarkable  Bones:  Unremarkable except as noted above.  IMPRESSION: 1. Moderate degenerative AC joint arthropathy with spurring and mild marrow edema. Unfavorable subacromial morphology but no rotator cuff tear is observed. 2. Mild subscapularis and infraspinatus tendinopathy. 3. Mild degenerative glenohumeral arthropathy.   Electronically Signed   By: Sherryl Barters M.D.   On: 01/27/2014 10:45    Scheduled Meds: . azithromycin  500 mg Intravenous Q24H  . budesonide (PULMICORT) nebulizer solution  0.25 mg Nebulization BID  . cefTRIAXone (ROCEPHIN)  IV  1 g Intravenous Q24H  . enoxaparin (  LOVENOX) injection  40 mg Subcutaneous Q24H  . guaiFENesin  600 mg Oral BID  . indomethacin  50 mg Oral BID WC  . [START ON 01/30/2014] ketoconazole  1 application Topical Once per day on Mon Thu  . simvastatin  10 mg Oral q1800   Continuous Infusions: . sodium chloride      Active Problems:   Pneumonia   Right shoulder pain   Hyperlipidemia   Acute renal insufficiency   CAP (community acquired pneumonia)   Pain in the chest    Time spent: 30 minutes    Barton Dubois  [facility] Pager [phone]. If 7PM-7AM, please contact night-coverage at [email or web address], password [facility] 01/28/2014, 2:55 PM  LOS: 2 days

## 2014-01-29 DIAGNOSIS — R05 Cough: Secondary | ICD-10-CM | POA: Insufficient documentation

## 2014-01-29 DIAGNOSIS — R059 Cough, unspecified: Secondary | ICD-10-CM | POA: Insufficient documentation

## 2014-01-29 LAB — BASIC METABOLIC PANEL
Anion gap: 8 (ref 5–15)
BUN: 14 mg/dL (ref 6–23)
CHLORIDE: 108 mmol/L (ref 96–112)
CO2: 24 mmol/L (ref 19–32)
CREATININE: 1.12 mg/dL (ref 0.50–1.35)
Calcium: 8.3 mg/dL — ABNORMAL LOW (ref 8.4–10.5)
GFR calc Af Amer: 74 mL/min — ABNORMAL LOW (ref >90)
GFR, EST NON AFRICAN AMERICAN: 64 mL/min — AB (ref >90)
Glucose, Bld: 101 mg/dL — ABNORMAL HIGH (ref 70–99)
Potassium: 3.5 mmol/L (ref 3.5–5.1)
SODIUM: 140 mmol/L (ref 135–145)

## 2014-01-29 MED ORDER — TRAMADOL HCL 50 MG PO TABS: 50.0000 mg | ORAL_TABLET | Freq: Four times a day (QID) | ORAL | Status: DC | PRN

## 2014-01-29 MED ORDER — METHOCARBAMOL 500 MG PO TABS: 500.0000 mg | ORAL_TABLET | Freq: Three times a day (TID) | ORAL | Status: DC | PRN

## 2014-01-29 MED ORDER — GUAIFENESIN ER 600 MG PO TB12: 600.0000 mg | ORAL_TABLET | Freq: Two times a day (BID) | ORAL | Status: DC

## 2014-01-29 MED ORDER — ALBUTEROL SULFATE HFA 108 (90 BASE) MCG/ACT IN AERS: 2.0000 | INHALATION_SPRAY | Freq: Four times a day (QID) | RESPIRATORY_TRACT | Status: DC | PRN

## 2014-01-29 MED ORDER — LEVOFLOXACIN 750 MG PO TABS: 750.0000 mg | ORAL_TABLET | Freq: Every day | ORAL | Status: DC

## 2014-01-29 NOTE — Progress Notes (Signed)
CARE MANAGEMENT NOTE 01/29/2014  Patient:  Timothy Evans, Timothy Evans   Account Number:  [id number]  Date Initiated:  01/29/2014  Documentation initiated by:  [facility]  Subjective/Objective Assessment:   SIRS     Action/Plan:   Anticipated DC Date:  01/29/2014   Anticipated DC Plan:  [place]  CM consult      Choice offered to / List presented to:          [facility] arranged  HH-1 RN      [place].   Status of service:  Completed, signed off Medicare Important Message given?   (If response is "NO", the following Medicare IM given date fields will be blank) Date Medicare IM given:   Medicare IM given by:   Date Additional Medicare IM given:   Additional Medicare IM given by:    Discharge Disposition:  [place]  Per UR Regulation:    If discussed at Long Length of Stay Meetings, dates discussed:    Comments:  01/29/2014 1330 NCM spoke to pt and offered choice for [facility]. Pt agreeable to [facility] for HH. States no DME needed. Jonnie Finner RN CCM Case Mgmt phone [phone]

## 2014-01-29 NOTE — Discharge Summary (Signed)
Physician Discharge Summary  Timothy Evans:[id number] DOB: 1940-04-07 DOA: 01/26/2014  PCP: No primary care provider on file.  Admit date: 01/26/2014 Discharge date: 01/29/2014  Time spent: 30 minutes  Recommendations for Outpatient Follow-up:  Repeat BMET to follow renal function Check CBC to follow WBC's trend Repeat CXR in 3-4 weeks to ensure resolution of infiltrates  Discharge Diagnoses:  Active Problems:   Pneumonia   Right shoulder pain   Hyperlipidemia   Acute renal insufficiency   CAP (community acquired pneumonia)   Pain in the chest   Discharge Condition: stable and improved. discharge home with instruction to take antibiotics, maintain good hydration and arrange follow up with PCP in 10 days.  Diet recommendation: low fat diet  Filed Weights   01/26/14 1939 01/27/14 0007  Weight: 81.194 kg (179 lb) 70.761 kg (156 lb)    History of present illness:  74 y.o. male with history of hyperlipidemia presents to the ER because of increasing cough with mild shortness of breath and pleuritic-type of chest pain. Patient states his family member was recently diagnosed with pneumonia 10 days ago. Patient started having the symptoms 5 days ago with persistent productive cough and some pleuritic type chest pain with some shortness of breath. Patient also has been experiencing right shoulder pain over the last 10 days which increases on abduction. Denies any trauma or fall. In the ER chest x-ray shows right upper lobe pneumonia. Patient was initially tachycardic and febrile with labs showing acute renal failure (no old labs to compare). Patient has been admitted for further management of pneumonia. Patient has not received travel and denies any night sweats or weight loss. Patient quit smoking almost 10 years ago.   Hospital Course:  1-SIRS/early sepsis due to Strep Pneumoniae CAP: improving and better. -no fever and WBC's trending down -no SOB and complaining of intermittent  cough -will discharge on levaquin to complete antibiotic therapy -continue mucinex  2-right shoulder pain: due to OA and tendinitis -will continue PRN analgesics, muscle relaxant and instructions for warm compresses -pain is better; but still present.  3-ARF: due to pre-renal azotemia -resolved with IVF's -advise to be careful with use of NSAID's -patient encouraged to keep good hydration   4-HLD: continue statins -advise to follow heart healthy diet  5-leukocytosis:due to CAP -trending down with antibiotics  -no fever -continue tx for PNA  Procedures:  See below for x-ray reports  Consultations:  None   Discharge Exam: Filed Vitals:   01/29/14 0528  BP: 121/59  Pulse: 80  Temp: 97.7 F (36.5 C)  Resp: 18    General: Afebrile, reports no further CP; still with some intermittent cough and still complaining of right shoulder pain (even is better).  Cardiovascular: S1 and S2, no rubs or gallops  Respiratory: scattered rhonchi, no wheezing; no use of accessory muscles. Improve air movement.    Abdomen: soft, NT, ND, positive BS  Musculoskeletal: no edema, no cyanosis or clubbing. Limited range of motion on his right shoulder and complaining of pain.  Discharge Instructions   Discharge Instructions    Discharge instructions    Complete by:  As directed   Please keep yourself well hydrated Take medications as prescribed Arrange follow up with PCP in 10 days          Current Discharge Medication List    START taking these medications   Details  albuterol (PROVENTIL HFA;VENTOLIN HFA) 108 (90 BASE) MCG/ACT inhaler Inhale 2 puffs into the lungs every 6 (six)  hours as needed for wheezing or shortness of breath. Qty: 1 Inhaler, Refills: 2    guaiFENesin (MUCINEX) 600 MG 12 hr tablet Take 1 tablet (600 mg total) by mouth 2 (two) times daily. Qty: 40 tablet, Refills: 0    levofloxacin (LEVAQUIN) 750 MG tablet Take 1 tablet (750 mg total) by mouth  daily. Qty: 7 tablet, Refills: 0    methocarbamol (ROBAXIN) 500 MG tablet Take 1 tablet (500 mg total) by mouth every 8 (eight) hours as needed for muscle spasms. Qty: 45 tablet, Refills: 0    traMADol (ULTRAM) 50 MG tablet Take 1 tablet (50 mg total) by mouth every 6 (six) hours as needed for moderate pain. Qty: 45 tablet, Refills: 0      CONTINUE these medications which have NOT CHANGED   Details  ketoconazole (NIZORAL) 2 % shampoo Apply 1 application topically 2 (two) times a week. Refills: 5    simvastatin (ZOCOR) 10 MG tablet Take 1 tablet by mouth daily. Refills: 5    Vitamin D, Ergocalciferol, (DRISDOL) 50000 UNITS CAPS capsule Take 1 capsule by mouth once a week. Every thursday Refills: 5      STOP taking these medications     diazepam (VALIUM) 10 MG tablet      predniSONE (DELTASONE) 20 MG tablet        No Known Allergies   The results of significant diagnostics from this hospitalization (including imaging, microbiology, ancillary and laboratory) are listed below for reference.    Significant Diagnostic Studies: Dg Chest 2 View  01/26/2014   CLINICAL DATA:  Pt complains of being weak, not eating in 4 days, and a productive cough. Pt also states that his right arm is very weak.  EXAM: CHEST  2 VIEW  COMPARISON:  None  FINDINGS: There is right upper lobe consolidation consistent with pneumonia. Lungs are mildly hyperexpanded. No pleural effusion or pneumothorax.  Heart, mediastinum and hila are unremarkable.  Bony thorax is demineralized but intact.  IMPRESSION: Right upper lobe pneumonia.a   Electronically Signed   By: Lajean Manes M.D.   On: 01/26/2014 20:05   Ct Angio Chest Pe W/cm &/or Wo Cm  01/27/2014   CLINICAL DATA:  Pneumonia with increasing cough, shortness of breath and pleuritic chest pain.  EXAM: CT ANGIOGRAPHY CHEST WITH CONTRAST  TECHNIQUE: Multidetector CT imaging of the chest was performed using the standard protocol during bolus administration of  intravenous contrast. Multiplanar CT image reconstructions and MIPs were obtained to evaluate the vascular anatomy.  CONTRAST:  167mL OMNIPAQUE IOHEXOL 350 MG/ML SOLN  COMPARISON:  Chest x-ray on 01/26/2014  FINDINGS: Dominant posterior right upper lobe infiltrate present. Milder patchy infiltrate present in the left upper lobe. There is a region of rounded appearing infiltrate versus atelectasis in the posterior left lower lobe. No associated pleural or pericardial fluid. Mildly prominent mediastinal nodes at the level of the AP window. The largest measures 14 mm in short axis.  Pulmonary arteries are well opacified and there is no evidence of pulmonary embolism. The thoracic aorta is normal in caliber. No airway obstruction identified. The thyroid gland appears unremarkable by CT. No bony abnormalities. Visualized upper abdomen is unremarkable.  Review of the MIP images confirms the above findings.  IMPRESSION: 1. Bilateral pneumonia, predominantly in the right upper lobe but also in the left upper lobe. There is also an area of rounded infiltrate versus atelectasis at the posterior left lung base. No associated pleural fluid. 2. No evidence of pulmonary embolism.  Electronically Signed   By: Aletta Edouard M.D.   On: 01/27/2014 09:33   Mr Shoulder Right Wo Contrast  01/27/2014   CLINICAL DATA:  Right shoulder pain.  Bilateral pneumonia.  EXAM: MRI OF THE RIGHT SHOULDER WITHOUT CONTRAST  TECHNIQUE: Multiplanar, multisequence MR imaging of the shoulder was performed. No intravenous contrast was administered.  COMPARISON:  01/27/2014  FINDINGS: Despite efforts by the technologist and patient, motion artifact is present on today's exam and could not be eliminated. This reduces exam sensitivity and specificity.  Rotator cuff:  Mild subscapularis and infraspinatus tendinopathy.  Muscles:  Unremarkable  Biceps long head:  Unremarkable  Acromioclavicular Joint: Moderate degenerative AC joint arthropathy with  adjacent marrow edema and mild spurring. The acromial undersurface is type 3 (hooked).  Glenohumeral Joint: Mild degenerative chondral thinning. Minimal spurring of the right humeral head. Coracohumeral ligament intact, no definite synovitis along the rotator interval or inferior glenohumeral ligament. No joint effusion.  Labrum:  Grossly unremarkable  Bones:  Unremarkable except as noted above.  IMPRESSION: 1. Moderate degenerative AC joint arthropathy with spurring and mild marrow edema. Unfavorable subacromial morphology but no rotator cuff tear is observed. 2. Mild subscapularis and infraspinatus tendinopathy. 3. Mild degenerative glenohumeral arthropathy.   Electronically Signed   By: Sherryl Barters M.D.   On: 01/27/2014 10:45    Microbiology: Recent Results (from the past 240 hour(s))  Culture, sputum-assessment     Status: None   Collection Time: 01/27/14 11:18 AM  Result Value Ref Range Status   Specimen Description SPUTUM  Final   Special Requests NONE  Final   Sputum evaluation   Final    THIS SPECIMEN IS ACCEPTABLE. RESPIRATORY CULTURE REPORT TO FOLLOW.   Report Status 01/27/2014 FINAL  Final  Culture, respiratory (NON-Expectorated)     Status: None (Preliminary result)   Collection Time: 01/27/14 11:18 AM  Result Value Ref Range Status   Specimen Description SPUTUM  Final   Special Requests NONE  Final   Gram Stain   Final    ABUNDANT WBC PRESENT, PREDOMINANTLY PMN FEW SQUAMOUS EPITHELIAL CELLS PRESENT MODERATE GRAM POSITIVE COCCI IN PAIRS IN CLUSTERS IN CHAINS MODERATE GRAM POSITIVE RODS MODERATE GRAM NEGATIVE RODS    Culture   Final    NORMAL OROPHARYNGEAL FLORA Performed at [facility]    Report Status PENDING  Incomplete     Labs: Basic Metabolic Panel:  Recent Labs Lab 01/26/14 2026 01/27/14 0500 01/28/14 0510 01/29/14 0526  NA 137 138 136 140  K 4.1 3.9 3.6 3.5  CL 104 107 106 108  CO2 19 22 23 24   GLUCOSE 145* 100* 88 101*  BUN 36* 26* 15 14   CREATININE 1.76* 1.22 1.11 1.12  CALCIUM 8.7 8.0* 7.9* 8.3*   Liver Function Tests:  Recent Labs Lab 01/26/14 2026 01/27/14 0500  AST 46* 64*  ALT 37 45  ALKPHOS 62 59  BILITOT 2.4* 2.0*  PROT 8.3 6.8  ALBUMIN 3.5 3.0*   CBC:  Recent Labs Lab 01/26/14 2026 01/27/14 0500 01/28/14 0510  WBC 16.4* 17.0* 12.6*  NEUTROABS 14.8* 14.3*  --   HGB 17.1* 14.8 14.1  HCT 48.0 42.0 40.5  MCV 88.4 88.4 89.0  PLT 243 247 265    Signed:  Barton Dubois  [facility] 01/29/2014, 1:10 PM

## 2014-01-29 NOTE — Progress Notes (Signed)
Pt discharged to home. DC instructions given. Prescriptions for 5 meds also given. Note to return to work given, per pt's request. No concerns voiced. Left unit in wheelchair pushed by nurse tech accompanied by male family member who is here to take pt to home. Left in good condition. Vwilliams,rn.

## 2014-01-30 LAB — CULTURE, RESPIRATORY W GRAM STAIN: Culture: NORMAL

## 2014-01-30 LAB — CULTURE, RESPIRATORY

## 2014-01-31 LAB — LEGIONELLA ANTIGEN, URINE

## 2014-04-07 ENCOUNTER — Emergency Department (HOSPITAL_COMMUNITY)
Admission: EM | Admit: 2014-04-07 | Discharge: 2014-04-08 | Disposition: A | Discharge status: Home / Self Care | Payer: PPO | Attending: Emergency Medicine | Admitting: Emergency Medicine

## 2014-04-07 ENCOUNTER — Encounter (HOSPITAL_COMMUNITY): Payer: Self-pay | Admitting: *Deleted

## 2014-04-07 ENCOUNTER — Emergency Department (HOSPITAL_COMMUNITY): Payer: PPO

## 2014-04-07 DIAGNOSIS — M1711 Unilateral primary osteoarthritis, right knee: Secondary | ICD-10-CM | POA: Diagnosis not present

## 2014-04-07 DIAGNOSIS — Z87891 Personal history of nicotine dependence: Secondary | ICD-10-CM | POA: Diagnosis not present

## 2014-04-07 DIAGNOSIS — Z79899 Other long term (current) drug therapy: Secondary | ICD-10-CM | POA: Insufficient documentation

## 2014-04-07 DIAGNOSIS — M25561 Pain in right knee: Secondary | ICD-10-CM | POA: Diagnosis present

## 2014-04-07 DIAGNOSIS — M25461 Effusion, right knee: Secondary | ICD-10-CM | POA: Insufficient documentation

## 2014-04-07 DIAGNOSIS — M1710 Unilateral primary osteoarthritis, unspecified knee: Secondary | ICD-10-CM

## 2014-04-07 DIAGNOSIS — E780 Pure hypercholesterolemia: Secondary | ICD-10-CM | POA: Insufficient documentation

## 2014-04-07 MED ORDER — NAPROXEN 375 MG PO TABS
375.0000 mg | ORAL_TABLET | Freq: Once | ORAL | Status: AC
  Administered 2014-04-08: 375 mg via ORAL
  Filled 2014-04-07: qty 1

## 2014-04-07 NOTE — ED Notes (Signed)
Pt reports R knee pain x 3 days.  Denies any injury at this time.  Pt is ambulatory with a limp.  Pt reports mild swelling to R knee

## 2014-04-07 NOTE — ED Provider Notes (Signed)
CSN: [id number]     Arrival date & time 04/07/14  2306 History  This chart was scribed for non-physician practitioner Junius Creamer, PA-C working with Quintella Reichert, MD by Timothy Evans, ED Scribe. This patient was seen in room [place] and the patient's care was started at 11:22 PM.   Chief Complaint  Patient presents with  . Knee Pain   Patient is a 74 y.o. male presenting with knee pain. The history is provided by the patient. No language interpreter was used.  Knee Pain    HPI Comments: Timothy Evans is a 74 y.o. male with past medical history of HLD who presents to the Emergency Department complaining of 3 days of right knee pain and mild swelling. 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 His pain is exacerbated with movement.   Past Medical History  Diagnosis Date  . High cholesterol    Past Surgical History  Procedure Laterality Date  . Neck surgery     Family History  Problem Relation Age of Onset  . Diabetes Mellitus II Neg Hx    History  Substance Use Topics  . Smoking status: Former Research scientist (life sciences)  . Smokeless tobacco: Not on file  . Alcohol Use: Yes     Comment: occasional    Review of Systems  Musculoskeletal: Positive for joint swelling (Right knee) and arthralgias (Right knee).    Allergies  Review of patient's allergies indicates no known allergies.  Home Medications   Prior to Admission medications   Medication Sig Start Date End Date Taking? Authorizing Provider  albuterol (PROVENTIL HFA;VENTOLIN HFA) 108 (90 BASE) MCG/ACT inhaler Inhale 2 puffs into the lungs every 6 (six) hours as needed for wheezing or shortness of breath. 01/29/14  Yes Barton Dubois, MD  guaiFENesin (MUCINEX) 600 MG 12 hr tablet Take 1 tablet (600 mg total) by mouth 2 (two) times daily. 01/29/14  Yes Barton Dubois, MD  ketoconazole (NIZORAL) 2 % shampoo Apply 1 application topically 2  (two) times a week. 01/09/14  Yes Historical Provider, MD  simvastatin (ZOCOR) 10 MG tablet Take 1 tablet by mouth daily. 01/09/14  Yes Historical Provider, MD  traMADol (ULTRAM) 50 MG tablet Take 1 tablet (50 mg total) by mouth every 6 (six) hours as needed for moderate pain. 01/29/14  Yes Barton Dubois, MD  levofloxacin (LEVAQUIN) 750 MG tablet Take 1 tablet (750 mg total) by mouth daily. Patient not taking: Reported on 04/08/2014 01/29/14   Barton Dubois, MD  methocarbamol (ROBAXIN) 500 MG tablet Take 1 tablet (500 mg total) by mouth every 8 (eight) hours as needed for muscle spasms. 01/29/14   Barton Dubois, MD  naproxen (NAPROSYN) 375 MG tablet Take 1 tablet (375 mg total) by mouth 3 (three) times daily with meals. 04/08/14   Junius Creamer, NP  Vitamin D, Ergocalciferol, (DRISDOL) 50000 UNITS CAPS capsule Take 1 capsule by mouth once a week. Every thursday 01/11/14   Historical Provider, MD   BP 151/82 mmHg  Pulse 85  Temp(Src) 98.6 F (37 C) (Oral)  Resp 17  SpO2 98% Physical Exam  Constitutional: He is oriented to person, place, and time. He appears well-developed and well-nourished.  HENT:  Head: Normocephalic and atraumatic.  Neck: No tracheal deviation present.  Cardiovascular: Normal rate.   Pulmonary/Chest: Effort normal.  Musculoskeletal:  Build up of fluid in the lateral, proximal area of the right knee; warm, but not  red.   Neurological: He is alert and oriented to person, place, and time.  Skin: Skin is warm and dry.  Psychiatric: He has a normal mood and affect. His behavior is normal.  Nursing note and vitals reviewed.   ED Course  Procedures   DIAGNOSTIC STUDIES: Oxygen Saturation is 98% on RA, normal by my interpretation.    COORDINATION OF CARE: 11:28 PM Discussed treatment plan with pt at bedside and pt agreed to plan.   Labs Review Labs Reviewed - No data to display  Imaging Review Dg Knee Complete 4 Views Right  04/08/2014   CLINICAL DATA:  Knee pain after injury.   EXAM: RIGHT KNEE - COMPLETE 4+ VIEW  COMPARISON:  None.  FINDINGS: No acute fracture deformity or dislocation. Joint space intact without erosions. Mild tricompartmental joint space narrowing and slight marginal spurring consistent with osteoarthrosis. Tibial spine peaking. No destructive bony lesions. Small suprapatellar joint effusion. Mild vascular calcifications.  IMPRESSION: Small suprapatellar joint effusion without acute fracture deformity or dislocation.  Mild tricompartmental osteoarthrosis.   Electronically Signed   By: Elon Alas   On: 04/08/2014 00:55     EKG Interpretation None      MDM   Final diagnoses:  Osteoarthritis of knee, unilateral  Knee effusion, right    I personally performed the services described in this documentation, which was scribed in my presence. The recorded information has been reviewed and is accurate.    Junius Creamer, NP 04/08/14 0106  Junius Creamer, NP 04/08/14 [place], MD 04/08/14 [phone]

## 2014-04-08 MED ORDER — NAPROXEN 375 MG PO TABS: 375.0000 mg | ORAL_TABLET | Freq: Three times a day (TID) | ORAL | Status: DC

## 2014-04-08 NOTE — ED Notes (Signed)
See triage note.

## 2014-04-08 NOTE — Discharge Instructions (Signed)
Her x-ray shows that you have osteoarthritis in your knee.  This is most likely been exacerbated by your walking and stress.  Even given a prescription for

## 2016-01-07 IMAGING — MR MR SHOULDER*R* W/O CM
4 of 6 series · 19 of 40 positions shown · non-contrast
Comparison: 01/27/2014

CLINICAL DATA: Right shoulder pain.  Bilateral pneumonia.

EXAM:
MRI OF THE RIGHT SHOULDER WITHOUT CONTRAST
TECHNIQUE: Multiplanar, multisequence MR imaging of the shoulder was performed.
No intravenous contrast was administered.

[Series 2: T2 fat-sat · axial · 4.0mm · 0.23mm/px · z∈[-72,+37]mm · 7 of 23 slices shown (1 of 3)]
[im 1/23]
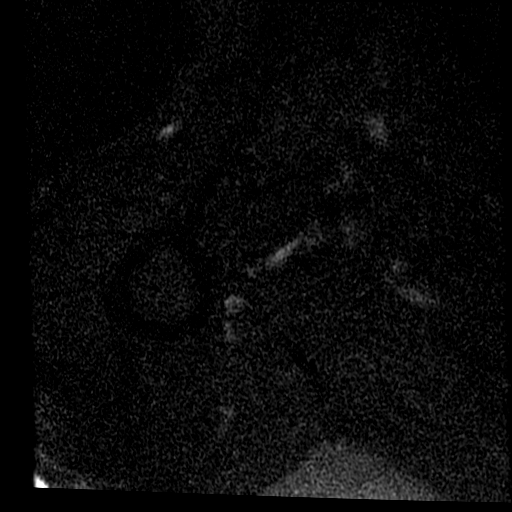
[im 4/23]
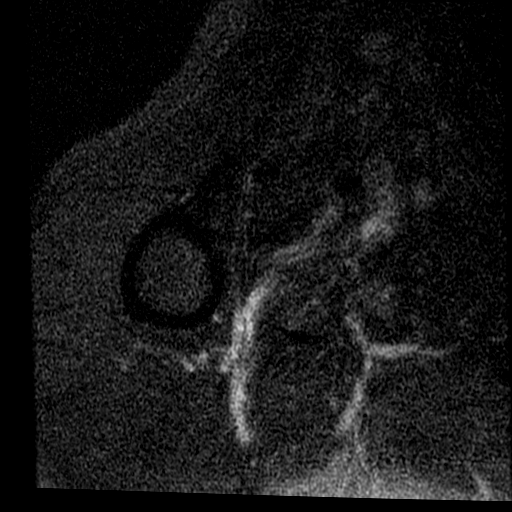
[im 8/23]
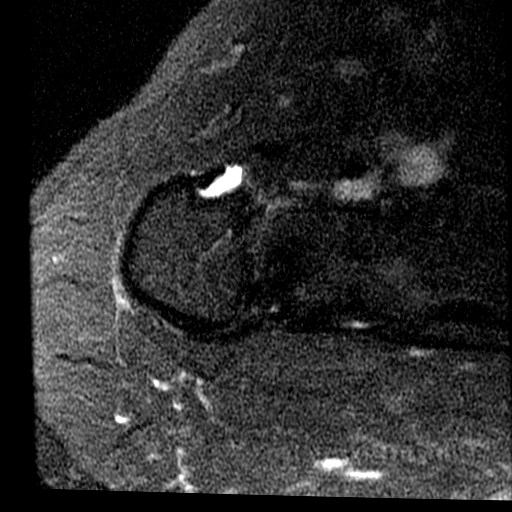
[im 12/23]
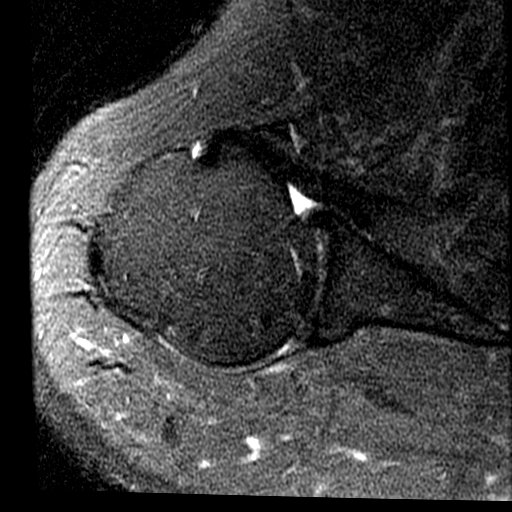
[im 15/23]
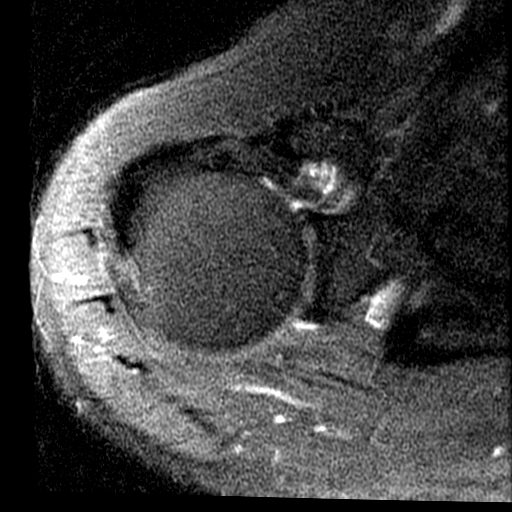
[im 19/23]
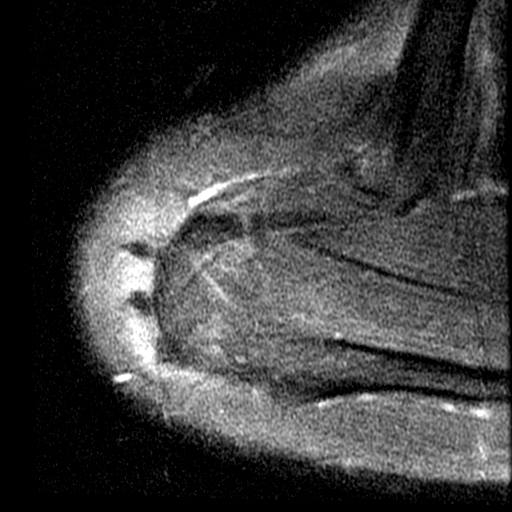
[im 23/23]
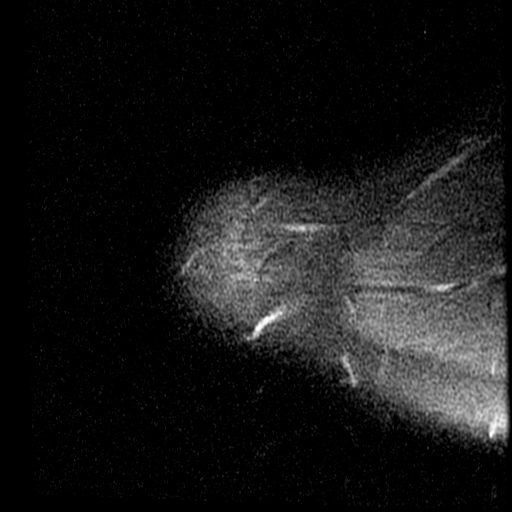

[Series 6: T2 fat-sat · sagittal · 4.0mm · 0.29mm/px · 3 of 17 slices shown (2 of 3)]
[im 4/17]
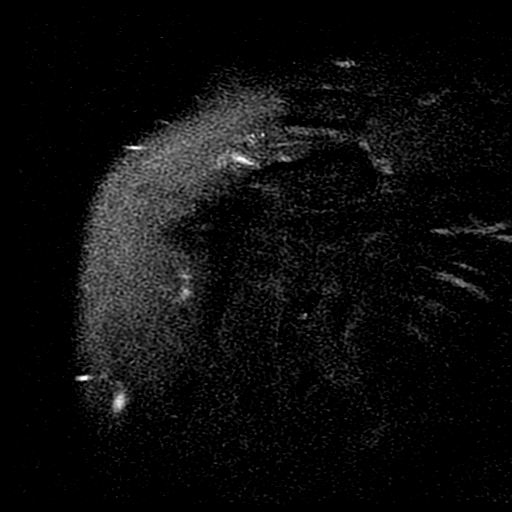
[im 10/17]
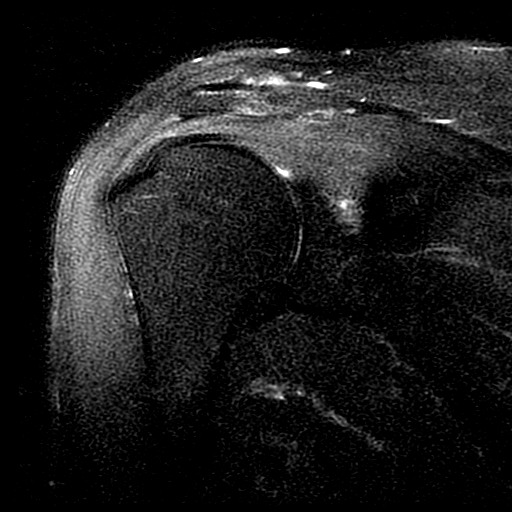
[im 17/17]
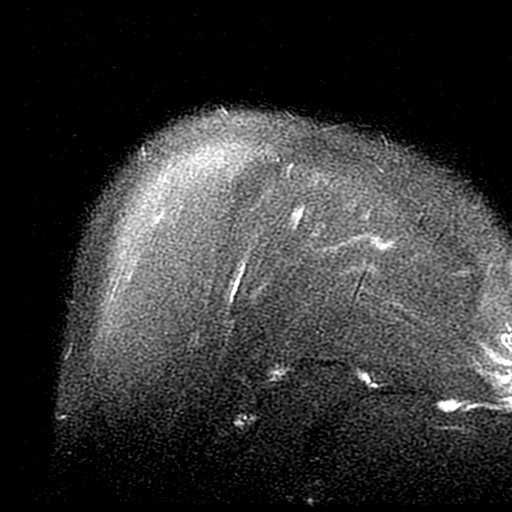

[Series 7: PD · sagittal · 4.0mm · 0.29mm/px · 6 of 17 slices shown]
[im 1/17]
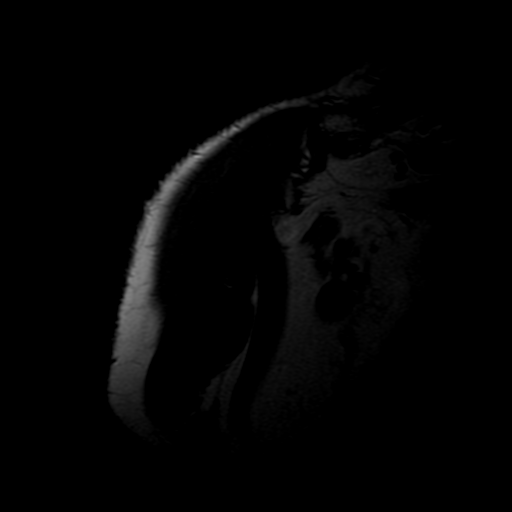
[im 4/17]
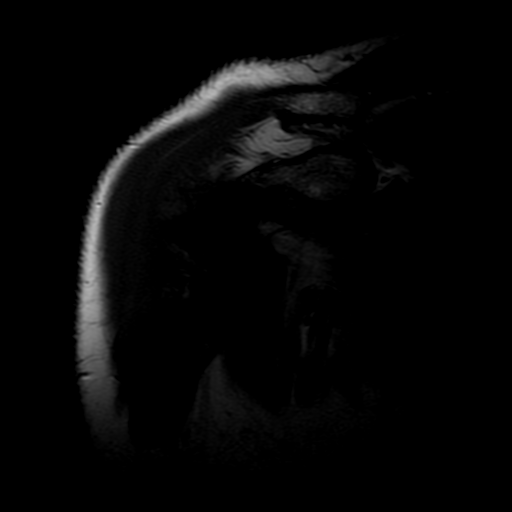
[im 7/17]
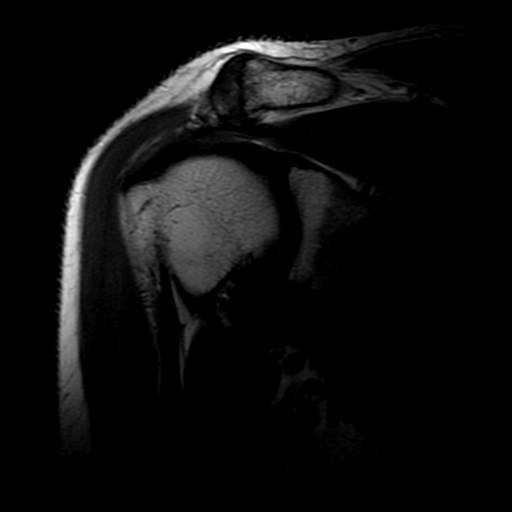
[im 10/17]
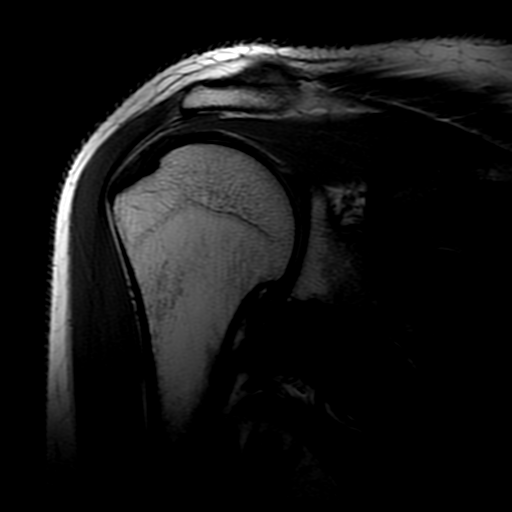
[im 13/17]
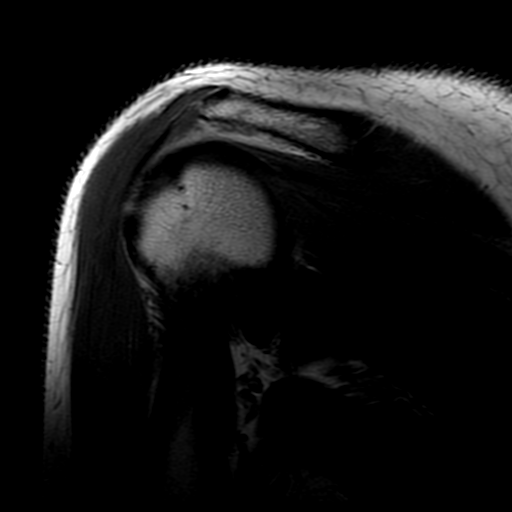
[im 17/17]
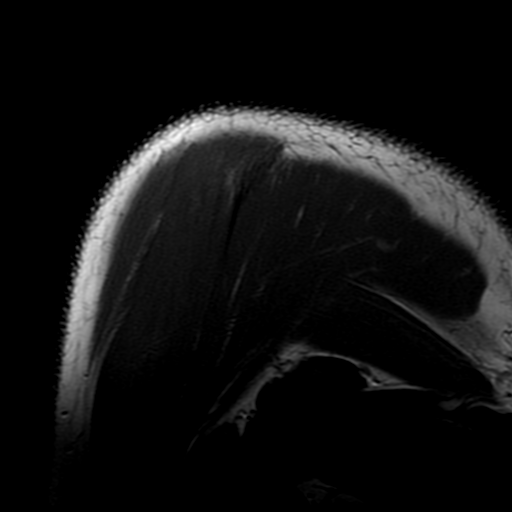

[Series 9: T2 fat-sat · coronal · 4.0mm · 0.29mm/px · 3 of 20 slices shown (3 of 3)]
[im 4/20]
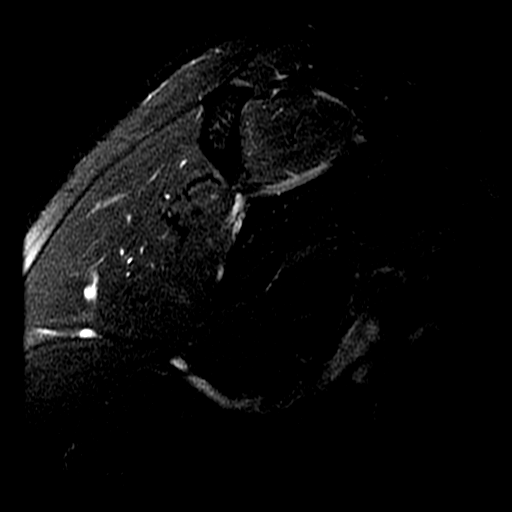
[im 10/20]
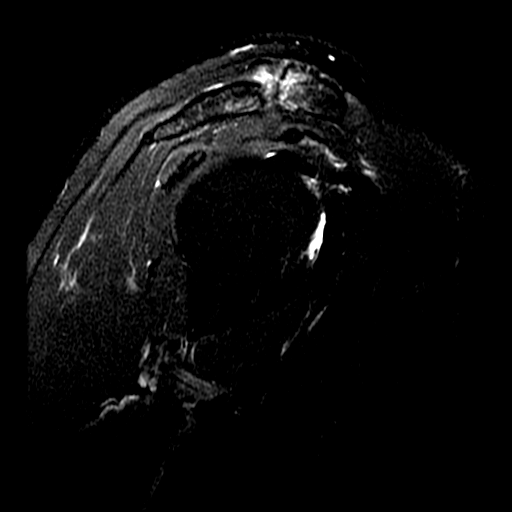
[im 16/20]
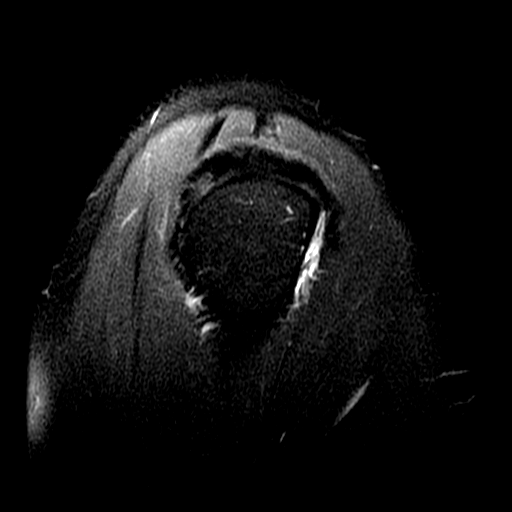

[19 of 40 positions shown; findings below may reference images not displayed]

FINDINGS: Despite efforts by the technologist and patient, motion artifact is
present on today's exam and could not be eliminated. This reduces
exam sensitivity and specificity.

Rotator cuff:  Mild subscapularis and infraspinatus tendinopathy.

Muscles:  Unremarkable

Biceps long head:  Unremarkable

Acromioclavicular Joint: Moderate degenerative AC joint arthropathy
with adjacent marrow edema and mild spurring. The acromial
undersurface is type 3 (hooked).

Glenohumeral Joint: Mild degenerative chondral thinning. Minimal
spurring of the right humeral head. Coracohumeral ligament intact,
no definite synovitis along the rotator interval or inferior
glenohumeral ligament. No joint effusion.

Labrum:  Grossly unremarkable

Bones:  Unremarkable except as noted above.
IMPRESSION: 1. Moderate degenerative AC joint arthropathy with spurring and mild
marrow edema. Unfavorable subacromial morphology but no rotator cuff
tear is observed.
2. Mild subscapularis and infraspinatus tendinopathy.
3. Mild degenerative glenohumeral arthropathy.

## 2016-03-17 IMAGING — CR DG KNEE COMPLETE 4+V*R*
4 series · 4 of 4 positions shown · non-contrast
Comparison: None.

CLINICAL DATA: Knee pain after injury.

EXAM:
RIGHT KNEE - COMPLETE 4+ VIEW

[t knee ap right]
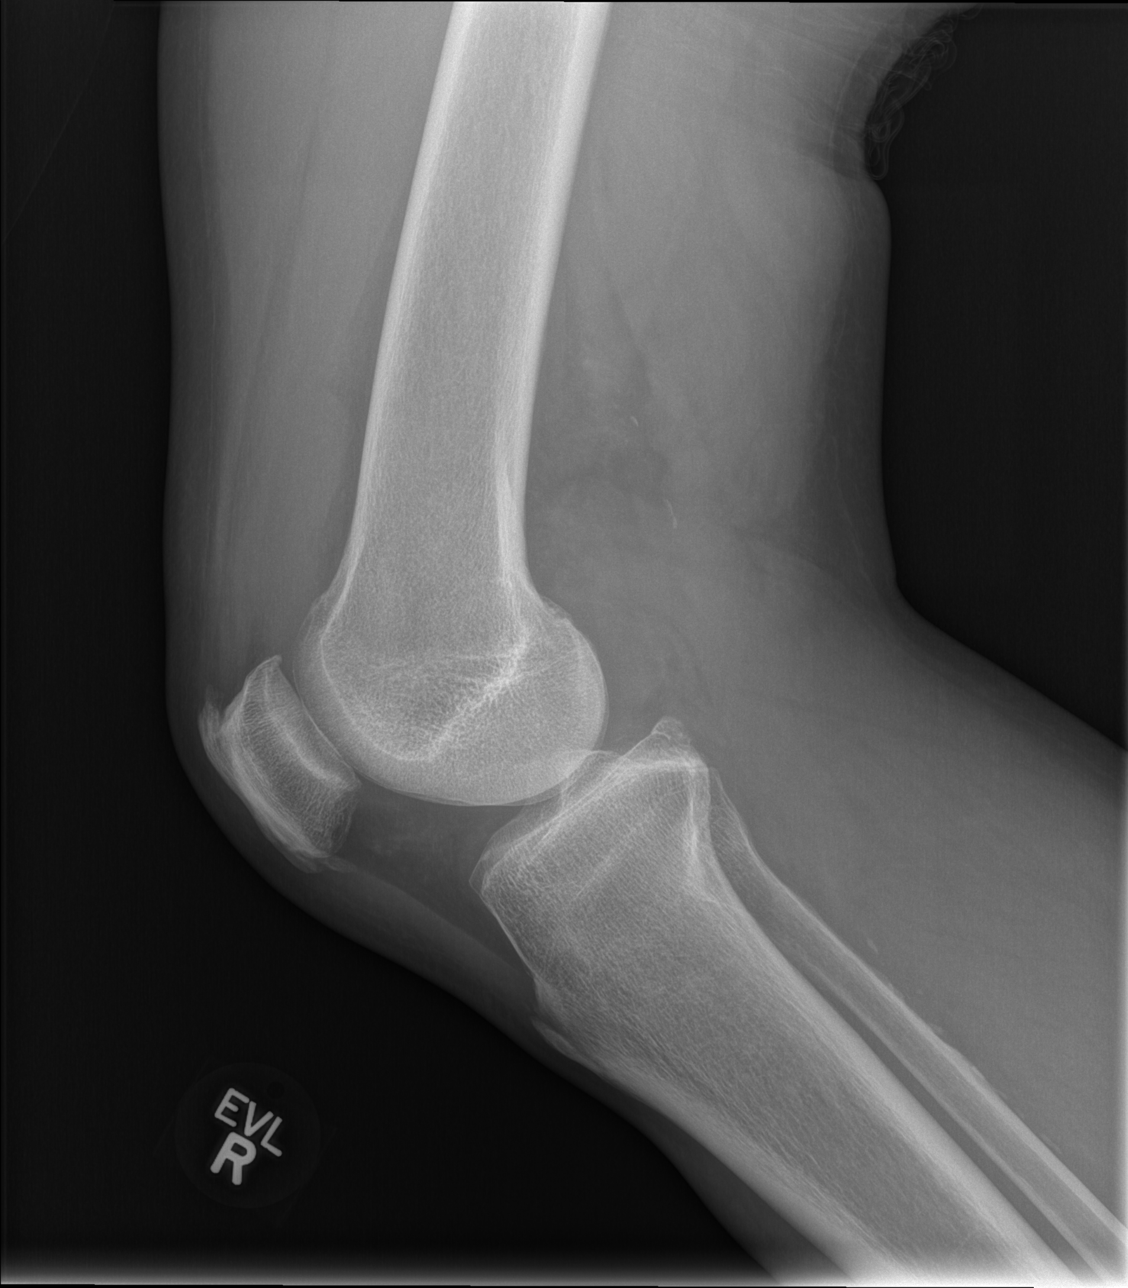

[x knee ap right]
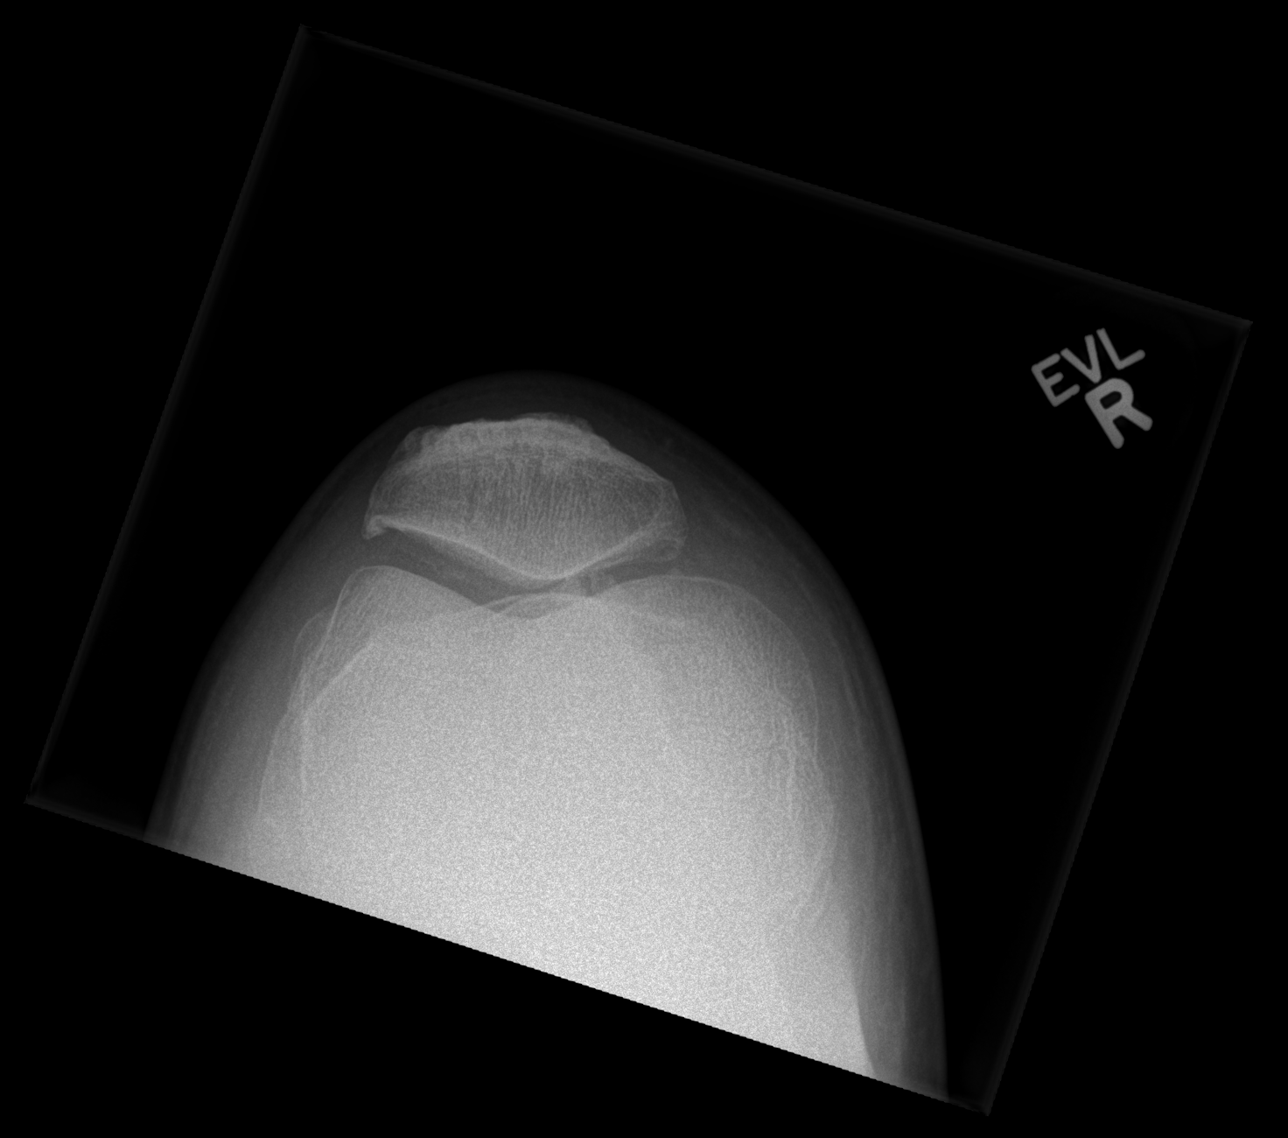

[w knee ap right (1 of 2)]
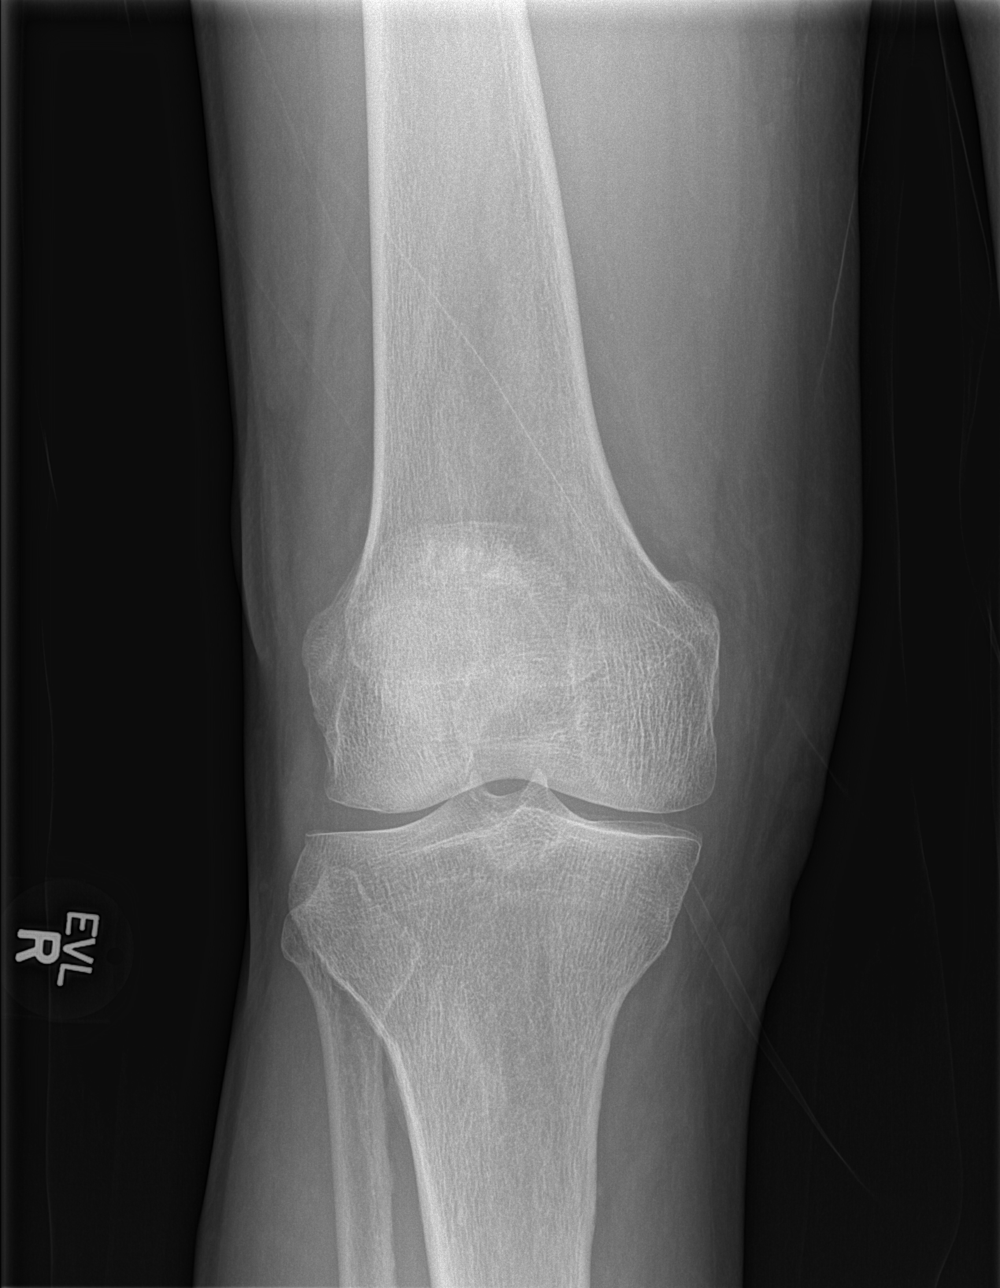

[w knee ap right (2 of 2)]
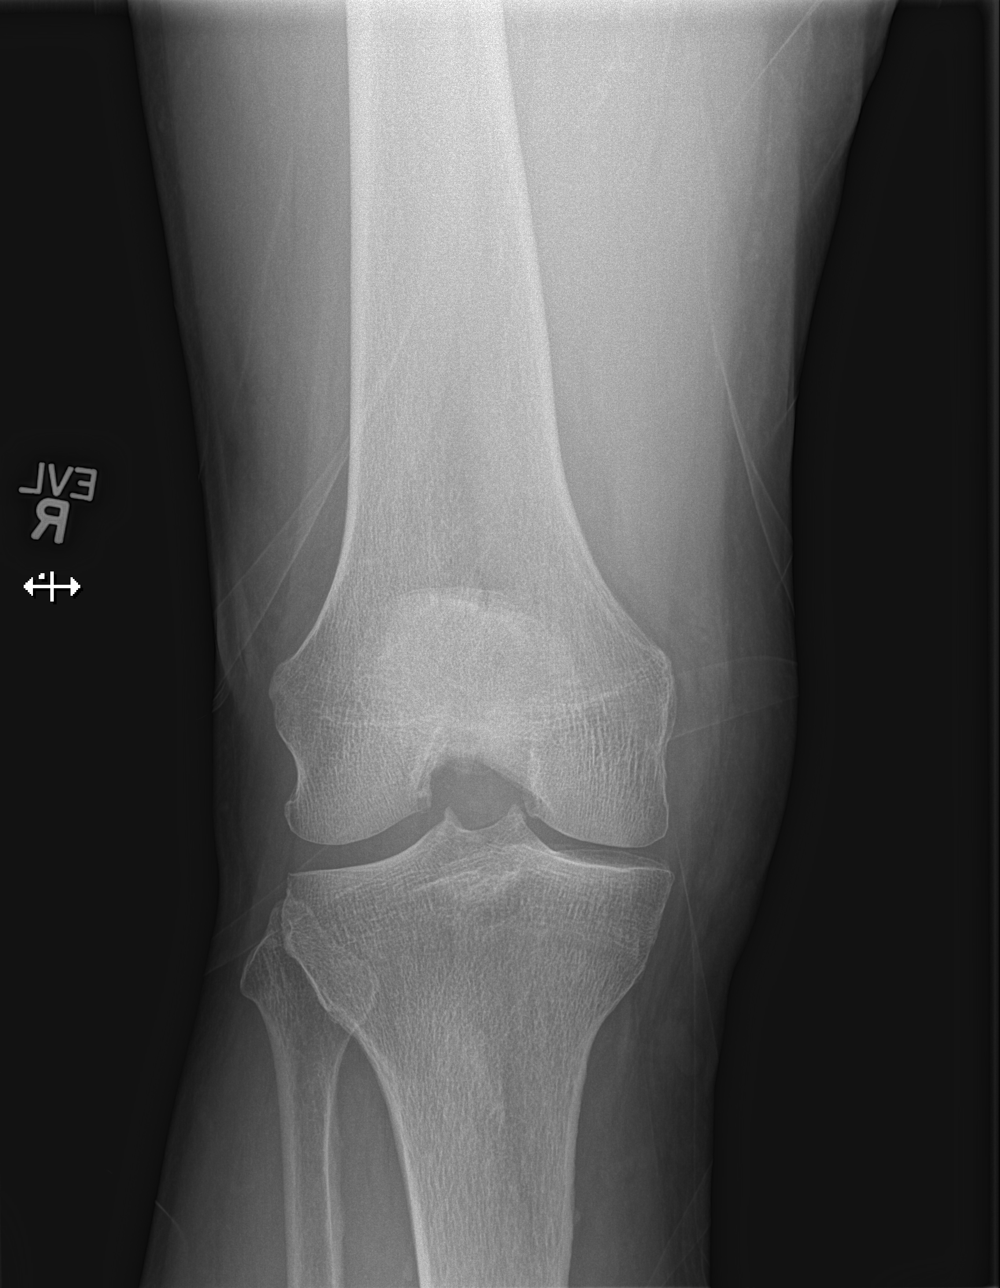

[4 of 4 positions shown; findings below may reference images not displayed]

FINDINGS: No acute fracture deformity or dislocation. Joint space intact
without erosions. Mild tricompartmental joint space narrowing and
slight marginal spurring consistent with osteoarthrosis. Tibial
spine peaking. No destructive bony lesions. Small suprapatellar
joint effusion. Mild vascular calcifications.
IMPRESSION: Small suprapatellar joint effusion without acute fracture deformity
or dislocation.

Mild tricompartmental osteoarthrosis.

  By: Kvng Heinz

## 2016-03-31 ENCOUNTER — Encounter: Payer: Self-pay | Admitting: Family Medicine

## 2016-03-31 ENCOUNTER — Ambulatory Visit: Payer: Medicare HMO | Attending: Family Medicine | Admitting: Family Medicine

## 2016-03-31 VITALS — BP 146/78 | HR 74 | Temp 98.2°F | Ht 69.0 in | Wt 174.6 lb

## 2016-03-31 DIAGNOSIS — Z8619 Personal history of other infectious and parasitic diseases: Secondary | ICD-10-CM

## 2016-03-31 DIAGNOSIS — R21 Rash and other nonspecific skin eruption: Secondary | ICD-10-CM | POA: Diagnosis not present

## 2016-03-31 MED ORDER — BACITRACIN-NEOMYCIN-POLYMYXIN 400-5-5000 EX OINT: 1.0000 "application " | TOPICAL_OINTMENT | CUTANEOUS | 0 refills | Status: DC | PRN

## 2016-03-31 MED ORDER — HYDROXYZINE HCL 50 MG PO TABS: 50.0000 mg | ORAL_TABLET | Freq: Three times a day (TID) | ORAL | 0 refills | Status: DC | PRN

## 2016-03-31 MED ORDER — TRIAMCINOLONE ACETONIDE 0.025 % EX OINT: 1.0000 "application " | TOPICAL_OINTMENT | Freq: Two times a day (BID) | CUTANEOUS | 1 refills | Status: DC | PRN

## 2016-03-31 MED ORDER — PERMETHRIN 5 % EX CREA: 1.0000 "application " | TOPICAL_CREAM | Freq: Once | CUTANEOUS | 0 refills | Status: AC

## 2016-03-31 NOTE — Patient Instructions (Signed)
Scabies, Adult Scabies is a skin condition that happens when very small insects get under the skin (infestation). This causes a rash and severe itchiness. Scabies can spread from person to person (is contagious). If you get scabies, it is common for others in your household to get scabies too. With proper treatment, symptoms usually go away in 2-4 weeks. Scabies usually does not cause lasting problems. What are the causes? This condition is caused by mites (Sarcoptes scabiei, or human itch mites) that can only be seen with a microscope. The mites get into the top layer of skin and lay eggs. Scabies can spread from person to person through:  Close contact with a person who has scabies.  Contact with infested items, such as towels, bedding, or clothing. What increases the risk? This condition is more likely to develop in:  People who live in nursing homes and other extended-care facilities.  People who have sexual contact with a partner who has scabies.  Young children who attend child care facilities.  People who care for others who are at increased risk for scabies. What are the signs or symptoms? Symptoms of this condition may include:  Severe itchiness. This is often worse at night.  A rash that includes tiny red bumps or blisters. The rash commonly occurs on the wrist, elbow, armpit, fingers, waist, groin, or buttocks. Bumps may form a line (burrow) in some areas.  Skin irritation. This can include scaly patches or sores. How is this diagnosed? This condition is diagnosed with a physical exam. Your health care provider will look closely at your skin. In some cases, your health care provider may take a sample of your affected skin (skin scraping) and have it examined under a microscope. How is this treated? This condition may be treated with:  Medicated cream or lotion that kills the mites. This is spread on the entire body and left on for several hours. Usually, one treatment with  medicated cream or lotion is enough to kill all of the mites. In severe cases, the treatment may be repeated.  Medicated cream that relieves itching.  Medicines that help to relieve itching.  Medicines that kill the mites. This treatment is rarely used. Follow these instructions at home:   Medicines   Take or apply over-the-counter and prescription medicines as told by your health care provider.  Apply medicated cream or lotion as told by your health care provider.  Do not wash off the medicated cream or lotion until the necessary amount of time has passed. Skin Care   Avoid scratching your affected skin.  Keep your fingernails closely trimmed to reduce injury from scratching.  Take cool baths or apply cool washcloths to help reduce itching. General instructions   Clean all items that you recently had contact with, including bedding, clothing, and furniture. Do this on the same day that your treatment starts.  Use hot water when you wash items.  Place unwashable items into closed, airtight plastic bags for at least 3 days. The mites cannot live for more than 3 days away from human skin.  Vacuum furniture and mattresses that you use.  Make sure that other people who may have been infested are examined by a health care provider. These include members of your household and anyone who may have had contact with infested items.  Keep all follow-up visits as told by your health care provider. This is important. Contact a health care provider if:  You have itching that does not go away   after 4 weeks of treatment.  You continue to develop new bumps or burrows.  You have redness, swelling, or pain in your rash area after treatment.  You have fluid, blood, or pus coming from your rash. This information is not intended to replace advice given to you by your health care provider. Make sure you discuss any questions you have with your health care provider. Document Released:  09/13/2014 Document Revised: 05/31/2015 Document Reviewed: 07/25/2014 Elsevier Interactive Patient Education  2017 [employer]  

## 2016-03-31 NOTE — Progress Notes (Signed)
Subjective:  Patient ID: Timothy Evans, male    DOB: Feb 02, 1940  Age: 76 y.o. MRN: [id number]  CC: Establish Care   HPI Timothy Evans presents for   Skin rash: Rash for about 6 month to bilateral arms, back, and chest. Worse left arm both arms. Reports itching  Reports history of dermatology visit in Nov.where he was was diagnosed with scabies and was given permethrin cream. Relieving symptoms include hot showers. Denies any contact with other who have similar symptoms. Denies any new soaps, detergents, plants, insects, and animals.    Outpatient Medications Prior to Visit  Medication Sig Dispense Refill  . albuterol (PROVENTIL HFA;VENTOLIN HFA) 108 (90 BASE) MCG/ACT inhaler Inhale 2 puffs into the lungs every 6 (six) hours as needed for wheezing or shortness of breath. 1 Inhaler 2  . guaiFENesin (MUCINEX) 600 MG 12 hr tablet Take 1 tablet (600 mg total) by mouth 2 (two) times daily. 40 tablet 0  . ketoconazole (NIZORAL) 2 % shampoo Apply 1 application topically 2 (two) times a week.  5  . levofloxacin (LEVAQUIN) 750 MG tablet Take 1 tablet (750 mg total) by mouth daily. (Patient not taking: Reported on 04/08/2014) 7 tablet 0  . methocarbamol (ROBAXIN) 500 MG tablet Take 1 tablet (500 mg total) by mouth every 8 (eight) hours as needed for muscle spasms. 45 tablet 0  . naproxen (NAPROSYN) 375 MG tablet Take 1 tablet (375 mg total) by mouth 3 (three) times daily with meals. 90 tablet 0  . simvastatin (ZOCOR) 10 MG tablet Take 1 tablet by mouth daily.  5  . traMADol (ULTRAM) 50 MG tablet Take 1 tablet (50 mg total) by mouth every 6 (six) hours as needed for moderate pain. 45 tablet 0  . Vitamin D, Ergocalciferol, (DRISDOL) 50000 UNITS CAPS capsule Take 1 capsule by mouth once a week. Every thursday  5   No facility-administered medications prior to visit.     ROS Review of Systems  Constitutional: Negative.   Respiratory: Negative.   Cardiovascular: Negative.   Gastrointestinal:  Negative.   Skin: Positive for rash.    Objective:  BP (!) 146/78 (BP Location: Right Arm, Patient Position: Sitting, Cuff Size: Normal)   Pulse 74   Temp 98.2 F (36.8 C)   Ht 5\' 9"  (1.753 m)   Wt 174 lb 9.6 oz (79.2 kg)   BMI 25.78 kg/m   BP/Weight 03/31/2016 04/07/2014 5/36/1443  Systolic BP 154 008 676  Diastolic BP 78 82 60  Wt. (Lbs) 174.6 - -  BMI 25.78 - -   Physical Exam  HENT:  Head: Normocephalic.  Right Ear: External ear normal.  Left Ear: External ear normal.  Nose: Nose normal.  Mouth/Throat: Oropharynx is clear and moist.  Eyes: Conjunctivae are normal. Pupils are equal, round, and reactive to light.  Left eye- pterygium   Neck: No JVD present.  Cardiovascular: Normal rate, regular rhythm, normal heart sounds and intact distal pulses.   Pulmonary/Chest: Effort normal and breath sounds normal.  Abdominal: Soft. Bowel sounds are normal.  Skin: Skin is warm and dry. Rash noted. Rash is maculopapular (generalized, some scattered areas of scabing and redness present, no drainage.).  Nursing note and vitals reviewed.   Assessment & Plan:   Problem List Items Addressed This Visit    None    Visit Diagnoses    Skin rash    -  Primary   Relevant Medications   triamcinolone (KENALOG) 0.025 % ointment   hydrOXYzine (  ATARAX/VISTARIL) 50 MG tablet   neomycin-bacitracin-polymyxin (NEOSPORIN) ointment   Other Relevant Orders   Ambulatory referral to Dermatology   History of scabies       Relevant Medications   permethrin (ELIMITE) 5 % cream      Meds ordered this encounter  Medications  . triamcinolone (KENALOG) 0.025 % ointment    Sig: Apply 1 application topically 2 (two) times daily as needed. Apply to affected areas.    Dispense:  30 g    Refill:  1    Order Specific Question:   Supervising Provider    Answer:   Tresa Garter [id number]  . hydrOXYzine (ATARAX/VISTARIL) 50 MG tablet    Sig: Take 1 tablet (50 mg total) by mouth every 8 (eight)  hours as needed.    Dispense:  30 tablet    Refill:  0    Order Specific Question:   Supervising Provider    Answer:   Tresa Garter [id number]  . permethrin (ELIMITE) 5 % cream    Sig: Apply 1 application topically once. Apply from neck to soles of feet. Leave on for 12-14 hours, then rinse. May repeat again in 1 week.    Dispense:  60 g    Refill:  0    Order Specific Question:   Supervising Provider    Answer:   Tresa Garter [id number]  . neomycin-bacitracin-polymyxin (NEOSPORIN) ointment    Sig: Apply 1 application topically as needed for wound care. Apply to affected areas.    Dispense:  15 g    Refill:  0    Order Specific Question:   Supervising Provider    Answer:   Tresa Garter [id number]    Follow-up: Return in about 2 weeks (around 04/14/2016) for Blood Pressure with PCP. Alfonse Spruce FNP

## 2016-03-31 NOTE — Progress Notes (Signed)
Patient is here for cholesterol   Patient is here for rashes on his upper body since a month ago went to the dermotologist  prescribe him Permethrin cream but it did not helped  Patent is not taking any current meds for today

## 2016-04-14 ENCOUNTER — Ambulatory Visit: Payer: Medicare HMO | Attending: Family Medicine | Admitting: Family Medicine

## 2016-04-14 ENCOUNTER — Telehealth: Payer: Self-pay | Admitting: Family Medicine

## 2016-04-14 VITALS — BP 145/75 | HR 83 | Temp 97.9°F | Resp 18 | Ht 69.0 in | Wt 179.8 lb

## 2016-04-14 DIAGNOSIS — I10 Essential (primary) hypertension: Secondary | ICD-10-CM | POA: Diagnosis not present

## 2016-04-14 DIAGNOSIS — Z87891 Personal history of nicotine dependence: Secondary | ICD-10-CM | POA: Diagnosis not present

## 2016-04-14 MED ORDER — LOSARTAN POTASSIUM 25 MG PO TABS: 25.0000 mg | ORAL_TABLET | Freq: Every day | ORAL | 0 refills | Status: DC

## 2016-04-14 MED ORDER — AMLODIPINE BESYLATE 5 MG PO TABS: 2.5000 mg | ORAL_TABLET | Freq: Every day | ORAL | 0 refills | Status: DC

## 2016-04-14 NOTE — Telephone Encounter (Signed)
I called PT.  @ 4:49pm 04/14/16 to inform that we have to refund the payment that he did for today visit since was an error in our system, and he will be get a bill for the copayment for today visit

## 2016-04-14 NOTE — Patient Instructions (Addendum)
Schedule appointment with PCP in 3 months for HTN. Start keeping log of blood pressures to bring to follow up appointments.  You will be called with your labs results.  Hypertension Hypertension is another name for high blood pressure. High blood pressure forces your heart to work harder to pump blood. This can cause problems over time. There are two numbers in a blood pressure reading. There is a top number (systolic) over a bottom number (diastolic). It is best to have a blood pressure below 120/80. Healthy choices can help lower your blood pressure. You may need medicine to help lower your blood pressure if:  Your blood pressure cannot be lowered with healthy choices.  Your blood pressure is higher than 130/80. Follow these instructions at home: Eating and drinking   If directed, follow the DASH eating plan. This diet includes:  Filling half of your plate at each meal with fruits and vegetables.  Filling one quarter of your plate at each meal with whole grains. Whole grains include whole wheat pasta, brown rice, and whole grain bread.  Eating or drinking low-fat dairy products, such as skim milk or low-fat yogurt.  Filling one quarter of your plate at each meal with low-fat (lean) proteins. Low-fat proteins include fish, skinless chicken, eggs, beans, and tofu.  Avoiding fatty meat, cured and processed meat, or chicken with skin.  Avoiding premade or processed food.  Eat less than 1,500 mg of salt (sodium) a day.  Limit alcohol use to no more than 1 drink a day for nonpregnant women and 2 drinks a day for men. One drink equals 12 oz of beer, 5 oz of wine, or 1 oz of hard liquor. Lifestyle   Work with your doctor to stay at a healthy weight or to lose weight. Ask your doctor what the best weight is for you.  Get at least 30 minutes of exercise that causes your heart to beat faster (aerobic exercise) most days of the week. This may include walking, swimming, or biking.  Get at  least 30 minutes of exercise that strengthens your muscles (resistance exercise) at least 3 days a week. This may include lifting weights or pilates.  Do not use any products that contain nicotine or tobacco. This includes cigarettes and e-cigarettes. If you need help quitting, ask your doctor.  Check your blood pressure at home as told by your doctor.  Keep all follow-up visits as told by your doctor. This is important. Medicines   Take over-the-counter and prescription medicines only as told by your doctor. Follow directions carefully.  Do not skip doses of blood pressure medicine. The medicine does not work as well if you skip doses. Skipping doses also puts you at risk for problems.  Ask your doctor about side effects or reactions to medicines that you should watch for. Contact a doctor if:  You think you are having a reaction to the medicine you are taking.  You have headaches that keep coming back (recurring).  You feel dizzy.  You have swelling in your ankles.  You have trouble with your vision. Get help right away if:  You get a very bad headache.  You start to feel confused.  You feel weak or numb.  You feel faint.  You get very bad pain in your:  Chest.  Belly (abdomen).  You throw up (vomit) more than once.  You have trouble breathing. Summary  Hypertension is another name for high blood pressure.  Making healthy choices can help lower  blood pressure. If your blood pressure cannot be controlled with healthy choices, you may need to take medicine. This information is not intended to replace advice given to you by your health care provider. Make sure you discuss any questions you have with your health care provider. Document Released: 06/11/2007 Document Revised: 11/21/2015 Document Reviewed: 11/21/2015 Elsevier Interactive Patient Education  2017 [place] DASH stands for "Dietary Approaches to Stop Hypertension." The DASH  eating plan is a healthy eating plan that has been shown to reduce high blood pressure (hypertension). It may also reduce your risk for type 2 diabetes, heart disease, and stroke. The DASH eating plan may also help with weight loss. What are tips for following this plan? General guidelines   Avoid eating more than 2,300 mg (milligrams) of salt (sodium) a day. If you have hypertension, you may need to reduce your sodium intake to 1,500 mg a day.  Limit alcohol intake to no more than 1 drink a day for nonpregnant women and 2 drinks a day for men. One drink equals 12 oz of beer, 5 oz of wine, or 1 oz of hard liquor.  Work with your health care provider to maintain a healthy body weight or to lose weight. Ask what an ideal weight is for you.  Get at least 30 minutes of exercise that causes your heart to beat faster (aerobic exercise) most days of the week. Activities may include walking, swimming, or biking.  Work with your health care provider or diet and nutrition specialist (dietitian) to adjust your eating plan to your individual calorie needs. Reading food labels   Check food labels for the amount of sodium per serving. Choose foods with less than 5 percent of the Daily Value of sodium. Generally, foods with less than 300 mg of sodium per serving fit into this eating plan.  To find whole grains, look for the word "whole" as the first word in the ingredient list. Shopping   Buy products labeled as "low-sodium" or "no salt added."  Buy fresh foods. Avoid canned foods and premade or frozen meals. Cooking   Avoid adding salt when cooking. Use salt-free seasonings or herbs instead of table salt or sea salt. Check with your health care provider or pharmacist before using salt substitutes.  Do not fry foods. Cook foods using healthy methods such as baking, boiling, grilling, and broiling instead.  Cook with heart-healthy oils, such as olive, canola, soybean, or sunflower oil. Meal planning      Eat a balanced diet that includes:  5 or more servings of fruits and vegetables each day. At each meal, try to fill half of your plate with fruits and vegetables.  Up to 6-8 servings of whole grains each day.  Less than 6 oz of lean meat, poultry, or fish each day. A 3-oz serving of meat is about the same size as a deck of cards. One egg equals 1 oz.  2 servings of low-fat dairy each day.  A serving of nuts, seeds, or beans 5 times each week.  Heart-healthy fats. Healthy fats called Omega-3 fatty acids are found in foods such as flaxseeds and coldwater fish, like sardines, salmon, and mackerel.  Limit how much you eat of the following:  Canned or prepackaged foods.  Food that is high in trans fat, such as fried foods.  Food that is high in saturated fat, such as fatty meat.  Sweets, desserts, sugary drinks, and other foods with added  sugar.  Full-fat dairy products.  Do not salt foods before eating.  Try to eat at least 2 vegetarian meals each week.  Eat more home-cooked food and less restaurant, buffet, and fast food.  When eating at a restaurant, ask that your food be prepared with less salt or no salt, if possible. What foods are recommended? The items listed may not be a complete list. Talk with your dietitian about what dietary choices are best for you. Grains  Whole-grain or whole-wheat bread. Whole-grain or whole-wheat pasta. Brown rice. [place]. Bulgur. Whole-grain and low-sodium cereals. Pita bread. Low-fat, low-sodium crackers. Whole-wheat flour tortillas. Vegetables  Fresh or frozen vegetables (raw, steamed, roasted, or grilled). Low-sodium or reduced-sodium tomato and vegetable juice. Low-sodium or reduced-sodium tomato sauce and tomato paste. Low-sodium or reduced-sodium canned vegetables. Fruits  All fresh, dried, or frozen fruit. Canned fruit in natural juice (without added sugar). Meat and other protein foods  Skinless chicken or [place]. Ground  chicken or [place]. Pork with fat trimmed off. Fish and seafood. Egg whites. Dried beans, peas, or lentils. Unsalted nuts, nut butters, and seeds. Unsalted canned beans. Lean cuts of beef with fat trimmed off. Low-sodium, lean deli meat. Dairy  Low-fat (1%) or fat-free (skim) milk. Fat-free, low-fat, or reduced-fat cheeses. Nonfat, low-sodium ricotta or cottage cheese. Low-fat or nonfat yogurt. Low-fat, low-sodium cheese. Fats and oils  Soft margarine without trans fats. Vegetable oil. Low-fat, reduced-fat, or light mayonnaise and salad dressings (reduced-sodium). Canola, safflower, olive, soybean, and sunflower oils. Avocado. Seasoning and other foods  Herbs. Spices. Seasoning mixes without salt. Unsalted popcorn and pretzels. Fat-free sweets. What foods are not recommended? The items listed may not be a complete list. Talk with your dietitian about what dietary choices are best for you. Grains  Baked goods made with fat, such as croissants, muffins, or some breads. Dry pasta or rice meal packs. Vegetables  Creamed or fried vegetables. Vegetables in a cheese sauce. Regular canned vegetables (not low-sodium or reduced-sodium). Regular canned tomato sauce and paste (not low-sodium or reduced-sodium). Regular tomato and vegetable juice (not low-sodium or reduced-sodium). Angie Fava. Olives. Fruits  Canned fruit in a light or heavy syrup. Fried fruit. Fruit in cream or butter sauce. Meat and other protein foods  Fatty cuts of meat. Ribs. Fried meat. Berniece Salines. Sausage. Bologna and other processed lunch meats. Salami. Fatback. Hotdogs. Bratwurst. Salted nuts and seeds. Canned beans with added salt. Canned or smoked fish. Whole eggs or egg yolks. Chicken or [place] with skin. Dairy  Whole or 2% milk, cream, and half-and-half. Whole or full-fat cream cheese. Whole-fat or sweetened yogurt. Full-fat cheese. Nondairy creamers. Whipped toppings. Processed cheese and cheese spreads. Fats and oils  Butter. Stick  margarine. Lard. Shortening. Ghee. Bacon fat. Tropical oils, such as coconut, palm kernel, or palm oil. Seasoning and other foods  Salted popcorn and pretzels. Onion salt, garlic salt, seasoned salt, table salt, and sea salt. Worcestershire sauce. Tartar sauce. Barbecue sauce. Teriyaki sauce. Soy sauce, including reduced-sodium. Steak sauce. Canned and packaged gravies. Fish sauce. Oyster sauce. Cocktail sauce. Horseradish that you find on the shelf. Ketchup. Mustard. Meat flavorings and tenderizers. Bouillon cubes. Hot sauce and Tabasco sauce. Premade or packaged marinades. Premade or packaged taco seasonings. Relishes. Regular salad dressings. Where to find more information:  National Heart, Lung, and [place]: https://wilson-eaton.com/  American Heart Association: www.heart.org Summary  The DASH eating plan is a healthy eating plan that has been shown to reduce high blood pressure (hypertension). It may also reduce your  risk for type 2 diabetes, heart disease, and stroke.  With the DASH eating plan, you should limit salt (sodium) intake to 2,300 mg a day. If you have hypertension, you may need to reduce your sodium intake to 1,500 mg a day.  When on the DASH eating plan, aim to eat more fresh fruits and vegetables, whole grains, lean proteins, low-fat dairy, and heart-healthy fats.  Work with your health care provider or diet and nutrition specialist (dietitian) to adjust your eating plan to your individual calorie needs. This information is not intended to replace advice given to you by your health care provider. Make sure you discuss any questions you have with your health care provider. Document Released: 12/12/2010 Document Revised: 12/17/2015 Document Reviewed: 12/17/2015 Elsevier Interactive Patient Education  2017 [employer].

## 2016-04-14 NOTE — Progress Notes (Signed)
Patient is here for f/up BP  Patient denies pain for today  Patient ha snot taking any current medication for today  Patient has eaten for today

## 2016-04-14 NOTE — Progress Notes (Signed)
Subjective:  Patient ID: Timothy Evans, male    DOB: Jun 23, 1940  Age: 76 y.o. MRN: [id number]  CC: Establish Care   HPI MIKAH POSS presents for   HTN: BP was found to be elevated at last office visit. He is a former smoker. He reports 20 year, 1/2 a pack per day history before quitting in 2007. Denies any CP, SOB, swelling of the BLE, or headaches. Denies any vision changes. Wears glasses. History of cataract removal.    Outpatient Medications Prior to Visit  Medication Sig Dispense Refill  . albuterol (PROVENTIL HFA;VENTOLIN HFA) 108 (90 BASE) MCG/ACT inhaler Inhale 2 puffs into the lungs every 6 (six) hours as needed for wheezing or shortness of breath. 1 Inhaler 2  . guaiFENesin (MUCINEX) 600 MG 12 hr tablet Take 1 tablet (600 mg total) by mouth 2 (two) times daily. 40 tablet 0  . hydrOXYzine (ATARAX/VISTARIL) 50 MG tablet Take 1 tablet (50 mg total) by mouth every 8 (eight) hours as needed. 30 tablet 0  . ketoconazole (NIZORAL) 2 % shampoo Apply 1 application topically 2 (two) times a week.  5  . levofloxacin (LEVAQUIN) 750 MG tablet Take 1 tablet (750 mg total) by mouth daily. (Patient not taking: Reported on 04/08/2014) 7 tablet 0  . methocarbamol (ROBAXIN) 500 MG tablet Take 1 tablet (500 mg total) by mouth every 8 (eight) hours as needed for muscle spasms. 45 tablet 0  . naproxen (NAPROSYN) 375 MG tablet Take 1 tablet (375 mg total) by mouth 3 (three) times daily with meals. 90 tablet 0  . neomycin-bacitracin-polymyxin (NEOSPORIN) ointment Apply 1 application topically as needed for wound care. Apply to affected areas. 15 g 0  . simvastatin (ZOCOR) 10 MG tablet Take 1 tablet by mouth daily.  5  . traMADol (ULTRAM) 50 MG tablet Take 1 tablet (50 mg total) by mouth every 6 (six) hours as needed for moderate pain. 45 tablet 0  . triamcinolone (KENALOG) 0.025 % ointment Apply 1 application topically 2 (two) times daily as needed. Apply to affected areas. 30 g 1  . Vitamin D,  Ergocalciferol, (DRISDOL) 50000 UNITS CAPS capsule Take 1 capsule by mouth once a week. Every thursday  5   No facility-administered medications prior to visit.     ROS Review of Systems  Eyes: Negative.   Respiratory: Negative.   Cardiovascular: Negative.   Gastrointestinal: Negative.   Skin: Negative.   Neurological: Negative.    Objective:  BP (!) 145/75 (BP Location: Left Arm, Patient Position: Sitting, Cuff Size: Normal)   Pulse 83   Temp 97.9 F (36.6 C) (Oral)   Resp 18   Ht 5\' 9"  (1.753 m)   Wt 179 lb 12.8 oz (81.6 kg)   PF 97 L/min   BMI 26.55 kg/m   BP/Weight 04/14/2016 06/26/3084 05/13/8467  Systolic BP 629 528 413  Diastolic BP 75 78 82  Wt. (Lbs) 179.8 174.6 -  BMI 26.55 25.78 -     Physical Exam  Eyes: Conjunctivae are normal. Pupils are equal, round, and reactive to light.  Neck: No JVD present.  Cardiovascular: Normal rate, regular rhythm, normal heart sounds and intact distal pulses.   Pulmonary/Chest: Effort normal and breath sounds normal.  Abdominal: Soft. Bowel sounds are normal.  Skin: Skin is warm and dry.  Nursing note and vitals reviewed.    Assessment & Plan:   Problem List Items Addressed This Visit    None    Visit Diagnoses  Essential hypertension    -  Primary   Schedule BP recheck in 2 weeks with clinic RN.   If BP is greater than 90/60 (MAP 65 or greater) but not less than 130/80 may increase dose of     amlodipine to 10 mg QD and recheck in another 2 weeks with clinic RN.   Relevant Medications   losartan (COZAAR) 25 MG tablet   amLODipine (NORVASC) 5 MG tablet   Other Relevant Orders   Lipid Panel (Completed)   Basic metabolic panel (Completed)   Microalbumin/Creatinine Ratio, Urine (Completed)      Meds ordered this encounter  Medications  . losartan (COZAAR) 25 MG tablet    Sig: Take 1 tablet (25 mg total) by mouth daily.    Dispense:  90 tablet    Refill:  0    Order Specific Question:   Supervising Provider     Answer:   Tresa Garter [id number]  . amLODipine (NORVASC) 5 MG tablet    Sig: Take 0.5 tablets (2.5 mg total) by mouth daily.    Dispense:  90 tablet    Refill:  0    Order Specific Question:   Supervising Provider    Answer:   Tresa Garter [[id number]]    Follow-up: Return in about 2 weeks (around 04/28/2016) for BP check with clinic RN.    Alfonse Spruce FNP

## 2016-04-15 LAB — BASIC METABOLIC PANEL
BUN/Creatinine Ratio: 14 (ref 10–24)
BUN: 15 mg/dL (ref 8–27)
CALCIUM: 9.2 mg/dL (ref 8.6–10.2)
CO2: 24 mmol/L (ref 18–29)
Chloride: 102 mmol/L (ref 96–106)
Creatinine, Ser: 1.11 mg/dL (ref 0.76–1.27)
GFR, EST AFRICAN AMERICAN: 76 mL/min/{1.73_m2} (ref >59)
GFR, EST NON AFRICAN AMERICAN: 66 mL/min/{1.73_m2} (ref >59)
Glucose: 113 mg/dL — ABNORMAL HIGH (ref 65–99)
POTASSIUM: 4.7 mmol/L (ref 3.5–5.2)
SODIUM: 140 mmol/L (ref 134–144)

## 2016-04-15 LAB — LIPID PANEL
CHOLESTEROL TOTAL: 232 mg/dL — AB (ref 100–199)
Chol/HDL Ratio: 4.9 ratio (ref 0.0–5.0)
HDL: 47 mg/dL (ref >39)
LDL Calculated: 143 mg/dL — ABNORMAL HIGH (ref 0–99)
TRIGLYCERIDES: 212 mg/dL — AB (ref 0–149)
VLDL CHOLESTEROL CAL: 42 mg/dL — AB (ref 5–40)

## 2016-04-15 LAB — MICROALBUMIN / CREATININE URINE RATIO
Creatinine, Urine: 196.7 mg/dL
MICROALB/CREAT RATIO: 4.1 mg/g{creat} (ref 0.0–30.0)
Microalbumin, Urine: 8 ug/mL

## 2016-04-16 ENCOUNTER — Other Ambulatory Visit: Payer: Self-pay | Admitting: Family Medicine

## 2016-04-16 DIAGNOSIS — E782 Mixed hyperlipidemia: Secondary | ICD-10-CM

## 2016-04-16 MED ORDER — ATORVASTATIN CALCIUM 20 MG PO TABS: 20.0000 mg | ORAL_TABLET | Freq: Every day | ORAL | 0 refills | Status: DC

## 2016-04-16 MED FILL — ?ATORVASTATIN 20 MG TABLET: 20 | 30 days supply | Qty: 30 | Fill #0

## 2016-04-16 NOTE — Telephone Encounter (Signed)
-----   Message from Alfonse Spruce, [place] sent at 04/16/2016  5:00 AM EDT ----- -Lipid levels were elevated. This can increase your risk of heart disease. Yo will be prescribed atorvastatin to help lower risk. Recommend lab recheck in 3 months.  -Start eating a diet low in saturated fat. Limit your intake of fried foods, red meats, and whole milk.  Microalbumin/creatinine ratio level was normal. This tests for protein in your urine that can indicate early signs of kidney damage.  Kidney function normal

## 2016-04-16 NOTE — Telephone Encounter (Signed)
CMA call patient to inform about lab results  Patient Verify DOB  Patient was aware and understood

## 2016-05-06 ENCOUNTER — Ambulatory Visit: Payer: Medicare HMO | Attending: Family Medicine

## 2016-05-06 NOTE — Progress Notes (Signed)
Patient here today for a BP check per [place], FNP BP today is 135/74.  Patient brought his BP machine that he purchased at [employer].  This RN showed him how to use it.  Writer compared numbers and they were very close. Patient stated understanding of how to use it and will test his BP in the morning and evening or if he isn't feeling well.

## 2016-07-14 ENCOUNTER — Encounter: Payer: Self-pay | Admitting: Family Medicine

## 2016-07-14 ENCOUNTER — Ambulatory Visit: Payer: Medicare HMO | Attending: Family Medicine | Admitting: Family Medicine

## 2016-07-14 VITALS — BP 135/73 | HR 77 | Temp 98.1°F | Resp 18 | Ht 69.0 in | Wt 179.8 lb

## 2016-07-14 DIAGNOSIS — Z23 Encounter for immunization: Secondary | ICD-10-CM

## 2016-07-14 DIAGNOSIS — E785 Hyperlipidemia, unspecified: Secondary | ICD-10-CM | POA: Insufficient documentation

## 2016-07-14 DIAGNOSIS — M791 Myalgia: Secondary | ICD-10-CM | POA: Diagnosis not present

## 2016-07-14 DIAGNOSIS — I10 Essential (primary) hypertension: Secondary | ICD-10-CM

## 2016-07-14 DIAGNOSIS — M7918 Myalgia, other site: Secondary | ICD-10-CM

## 2016-07-14 DIAGNOSIS — Z0000 Encounter for general adult medical examination without abnormal findings: Secondary | ICD-10-CM

## 2016-07-14 DIAGNOSIS — H11002 Unspecified pterygium of left eye: Secondary | ICD-10-CM | POA: Diagnosis not present

## 2016-07-14 MED ORDER — LOSARTAN POTASSIUM 25 MG PO TABS: 25.0000 mg | ORAL_TABLET | Freq: Every day | ORAL | 0 refills | Status: DC

## 2016-07-14 MED ORDER — IBUPROFEN 600 MG PO TABS: 600.0000 mg | ORAL_TABLET | Freq: Four times a day (QID) | ORAL | 0 refills | Status: DC | PRN

## 2016-07-14 MED ORDER — AMLODIPINE BESYLATE 5 MG PO TABS: 2.5000 mg | ORAL_TABLET | Freq: Every day | ORAL | 0 refills | Status: DC

## 2016-07-14 NOTE — Progress Notes (Signed)
Subjective:  Patient ID: Timothy Evans, male    DOB: 1940/03/29  Age: 76 y.o. MRN: [id number]  CC: Hypertension   HPI Timothy Evans presents for history of hypertension. He is not exercising and is adherent to low salt diet.  He does not check BP at home. Cardiac symptoms none. Patient denies chest pain, chest pressure/discomfort, claudication, dyspnea, lower extremity edema, near-syncope, palpitations and syncope.  Cardiovascular risk factors: advanced age (older than 44 for men, 9 for women), dyslipidemia, hypertension, male gender and sedentary lifestyle. Use of agents associated with hypertension: none. History of target organ damage: none. He reports history of recent eye examination at [facility], with upcoming follow up in 6 weeks. He complains of myalgias for which has been present for 2 to 3 weeks. Pain is located in the right shoulder(s), is described as aching, and is intermittent .  He reports recent visit to [facility]. He denies any  decreased range of motion, instability and tenderness.  The patient has tried nothing for pain relief.  Related to injury:   no.     Outpatient Medications Prior to Visit  Medication Sig Dispense Refill  . albuterol (PROVENTIL HFA;VENTOLIN HFA) 108 (90 BASE) MCG/ACT inhaler Inhale 2 puffs into the lungs every 6 (six) hours as needed for wheezing or shortness of breath. 1 Inhaler 2  . atorvastatin (LIPITOR) 20 MG tablet Take 1 tablet (20 mg total) by mouth daily. 90 tablet 0  . hydrOXYzine (ATARAX/VISTARIL) 50 MG tablet Take 1 tablet (50 mg total) by mouth every 8 (eight) hours as needed. 30 tablet 0  . levofloxacin (LEVAQUIN) 750 MG tablet Take 1 tablet (750 mg total) by mouth daily. (Patient not taking: Reported on 04/08/2014) 7 tablet 0  . neomycin-bacitracin-polymyxin (NEOSPORIN) ointment Apply 1 application topically as needed for wound care. Apply to affected areas. 15 g 0  . triamcinolone (KENALOG) 0.025 % ointment Apply 1  application topically 2 (two) times daily as needed. Apply to affected areas. 30 g 1  . Vitamin D, Ergocalciferol, (DRISDOL) 50000 UNITS CAPS capsule Take 1 capsule by mouth once a week. Every thursday  5  . amLODipine (NORVASC) 5 MG tablet Take 0.5 tablets (2.5 mg total) by mouth daily. 90 tablet 0  . guaiFENesin (MUCINEX) 600 MG 12 hr tablet Take 1 tablet (600 mg total) by mouth 2 (two) times daily. 40 tablet 0  . ketoconazole (NIZORAL) 2 % shampoo Apply 1 application topically 2 (two) times a week.  5  . losartan (COZAAR) 25 MG tablet Take 1 tablet (25 mg total) by mouth daily. 90 tablet 0  . methocarbamol (ROBAXIN) 500 MG tablet Take 1 tablet (500 mg total) by mouth every 8 (eight) hours as needed for muscle spasms. 45 tablet 0  . naproxen (NAPROSYN) 375 MG tablet Take 1 tablet (375 mg total) by mouth 3 (three) times daily with meals. 90 tablet 0  . simvastatin (ZOCOR) 10 MG tablet Take 1 tablet by mouth daily.  5  . traMADol (ULTRAM) 50 MG tablet Take 1 tablet (50 mg total) by mouth every 6 (six) hours as needed for moderate pain. 45 tablet 0   No facility-administered medications prior to visit.     ROS Review of Systems  Constitutional: Negative.   Eyes: Negative.   Respiratory: Negative.   Cardiovascular: Negative.   Gastrointestinal: Negative.   Musculoskeletal: Positive for myalgias.  Skin: Negative.     Objective:  BP 135/73   Pulse 77  Temp 98.1 F (36.7 C) (Oral)   Resp 18   Ht 5\' 9"  (1.753 m)   Wt 179 lb 12.8 oz (81.6 kg)   SpO2 98%   BMI 26.55 kg/m   BP/Weight 07/14/2016 07/09/4191 07/15/238  Systolic BP 973 532 992  Diastolic BP 73 74 75  Wt. (Lbs) 179.8 - 179.8  BMI 26.55 - 26.55    Physical Exam  Constitutional: He appears well-developed and well-nourished.  Eyes: Conjunctivae are normal. Pupils are equal, round, and reactive to light.    Neck: No JVD present.  Cardiovascular: Normal rate, regular rhythm, normal heart sounds and intact distal pulses.     Pulmonary/Chest: Effort normal and breath sounds normal.  Abdominal: Soft. Bowel sounds are normal.  Musculoskeletal:       Right shoulder: He exhibits pain.  Skin: Skin is warm and dry.  Nursing note and vitals reviewed.  Assessment & Plan:   Problem List Items Addressed This Visit    None    Visit Diagnoses    Essential hypertension    -  Primary   BP well controlled on current medications   Relevant Medications   amLODipine (NORVASC) 5 MG tablet   losartan (COZAAR) 25 MG tablet   Other Relevant Orders   Lipid Panel (Completed)   Musculoskeletal pain       Relevant Medications   ibuprofen (ADVIL,MOTRIN) 600 MG tablet   Pterygium of left eye       Pt.asymptomatic. Encouraged completing follow up visit with ophthalmologist   Healthcare maintenance       Relevant Orders   Pneumococcal conjugate vaccine 13-valent (Completed)      Meds ordered this encounter  Medications  . amLODipine (NORVASC) 5 MG tablet    Sig: Take 0.5 tablets (2.5 mg total) by mouth daily.    Dispense:  90 tablet    Refill:  0    Order Specific Question:   Supervising Provider    Answer:   Tresa Garter [id number]  . losartan (COZAAR) 25 MG tablet    Sig: Take 1 tablet (25 mg total) by mouth daily.    Dispense:  90 tablet    Refill:  0    Order Specific Question:   Supervising Provider    Answer:   Tresa Garter [id number]  . ibuprofen (ADVIL,MOTRIN) 600 MG tablet    Sig: Take 1 tablet (600 mg total) by mouth every 6 (six) hours as needed for moderate pain (Take with food.).    Dispense:  30 tablet    Refill:  0    Order Specific Question:   Supervising Provider    Answer:   Tresa Garter [id number]    Follow-up: Return in about 3 months (around 10/14/2016) for HTN/HLD.   Alfonse Spruce FNP

## 2016-07-14 NOTE — Progress Notes (Signed)
Patient is here for f/up HTN 

## 2016-07-14 NOTE — Patient Instructions (Signed)
DASH Eating Plan DASH stands for "Dietary Approaches to Stop Hypertension." The DASH eating plan is a healthy eating plan that has been shown to reduce high blood pressure (hypertension). It may also reduce your risk for type 2 diabetes, heart disease, and stroke. The DASH eating plan may also help with weight loss. What are tips for following this plan? General guidelines  Avoid eating more than 2,300 mg (milligrams) of salt (sodium) a day. If you have hypertension, you may need to reduce your sodium intake to 1,500 mg a day.  Limit alcohol intake to no more than 1 drink a day for nonpregnant women and 2 drinks a day for men. One drink equals 12 oz of beer, 5 oz of wine, or 1 oz of hard liquor.  Work with your health care provider to maintain a healthy body weight or to lose weight. Ask what an ideal weight is for you.  Get at least 30 minutes of exercise that causes your heart to beat faster (aerobic exercise) most days of the week. Activities may include walking, swimming, or biking.  Work with your health care provider or diet and nutrition specialist (dietitian) to adjust your eating plan to your individual calorie needs. Reading food labels  Check food labels for the amount of sodium per serving. Choose foods with less than 5 percent of the Daily Value of sodium. Generally, foods with less than 300 mg of sodium per serving fit into this eating plan.  To find whole grains, look for the word "whole" as the first word in the ingredient list. Shopping  Buy products labeled as "low-sodium" or "no salt added."  Buy fresh foods. Avoid canned foods and premade or frozen meals. Cooking  Avoid adding salt when cooking. Use salt-free seasonings or herbs instead of table salt or sea salt. Check with your health care provider or pharmacist before using salt substitutes.  Do not fry foods. Cook foods using healthy methods such as baking, boiling, grilling, and broiling instead.  Cook with  heart-healthy oils, such as olive, canola, soybean, or sunflower oil. Meal planning   Eat a balanced diet that includes: ? 5 or more servings of fruits and vegetables each day. At each meal, try to fill half of your plate with fruits and vegetables. ? Up to 6-8 servings of whole grains each day. ? Less than 6 oz of lean meat, poultry, or fish each day. A 3-oz serving of meat is about the same size as a deck of cards. One egg equals 1 oz. ? 2 servings of low-fat dairy each day. ? A serving of nuts, seeds, or beans 5 times each week. ? Heart-healthy fats. Healthy fats called Omega-3 fatty acids are found in foods such as flaxseeds and coldwater fish, like sardines, salmon, and mackerel.  Limit how much you eat of the following: ? Canned or prepackaged foods. ? Food that is high in trans fat, such as fried foods. ? Food that is high in saturated fat, such as fatty meat. ? Sweets, desserts, sugary drinks, and other foods with added sugar. ? Full-fat dairy products.  Do not salt foods before eating.  Try to eat at least 2 vegetarian meals each week.  Eat more home-cooked food and less restaurant, buffet, and fast food.  When eating at a restaurant, ask that your food be prepared with less salt or no salt, if possible. What foods are recommended? The items listed may not be a complete list. Talk with your dietitian about what   dietary choices are best for you. Grains Whole-grain or whole-wheat bread. Whole-grain or whole-wheat pasta. Brown rice. Oatmeal. Quinoa. Bulgur. Whole-grain and low-sodium cereals. Pita bread. Low-fat, low-sodium crackers. Whole-wheat flour tortillas. Vegetables Fresh or frozen vegetables (raw, steamed, roasted, or grilled). Low-sodium or reduced-sodium tomato and vegetable juice. Low-sodium or reduced-sodium tomato sauce and tomato paste. Low-sodium or reduced-sodium canned vegetables. Fruits All fresh, dried, or frozen fruit. Canned fruit in natural juice (without  added sugar). Meat and other protein foods Skinless chicken or turkey. Ground chicken or turkey. Pork with fat trimmed off. Fish and seafood. Egg whites. Dried beans, peas, or lentils. Unsalted nuts, nut butters, and seeds. Unsalted canned beans. Lean cuts of beef with fat trimmed off. Low-sodium, lean deli meat. Dairy Low-fat (1%) or fat-free (skim) milk. Fat-free, low-fat, or reduced-fat cheeses. Nonfat, low-sodium ricotta or cottage cheese. Low-fat or nonfat yogurt. Low-fat, low-sodium cheese. Fats and oils Soft margarine without trans fats. Vegetable oil. Low-fat, reduced-fat, or light mayonnaise and salad dressings (reduced-sodium). Canola, safflower, olive, soybean, and sunflower oils. Avocado. Seasoning and other foods Herbs. Spices. Seasoning mixes without salt. Unsalted popcorn and pretzels. Fat-free sweets. What foods are not recommended? The items listed may not be a complete list. Talk with your dietitian about what dietary choices are best for you. Grains Baked goods made with fat, such as croissants, muffins, or some breads. Dry pasta or rice meal packs. Vegetables Creamed or fried vegetables. Vegetables in a cheese sauce. Regular canned vegetables (not low-sodium or reduced-sodium). Regular canned tomato sauce and paste (not low-sodium or reduced-sodium). Regular tomato and vegetable juice (not low-sodium or reduced-sodium). Pickles. Olives. Fruits Canned fruit in a light or heavy syrup. Fried fruit. Fruit in cream or butter sauce. Meat and other protein foods Fatty cuts of meat. Ribs. Fried meat. Bacon. Sausage. Bologna and other processed lunch meats. Salami. Fatback. Hotdogs. Bratwurst. Salted nuts and seeds. Canned beans with added salt. Canned or smoked fish. Whole eggs or egg yolks. Chicken or turkey with skin. Dairy Whole or 2% milk, cream, and half-and-half. Whole or full-fat cream cheese. Whole-fat or sweetened yogurt. Full-fat cheese. Nondairy creamers. Whipped toppings.  Processed cheese and cheese spreads. Fats and oils Butter. Stick margarine. Lard. Shortening. Ghee. Bacon fat. Tropical oils, such as coconut, palm kernel, or palm oil. Seasoning and other foods Salted popcorn and pretzels. Onion salt, garlic salt, seasoned salt, table salt, and sea salt. Worcestershire sauce. Tartar sauce. Barbecue sauce. Teriyaki sauce. Soy sauce, including reduced-sodium. Steak sauce. Canned and packaged gravies. Fish sauce. Oyster sauce. Cocktail sauce. Horseradish that you find on the shelf. Ketchup. Mustard. Meat flavorings and tenderizers. Bouillon cubes. Hot sauce and Tabasco sauce. Premade or packaged marinades. Premade or packaged taco seasonings. Relishes. Regular salad dressings. Where to find more information:  National Heart, Lung, and Blood Institute: www.nhlbi.nih.gov  American Heart Association: www.heart.org Summary  The DASH eating plan is a healthy eating plan that has been shown to reduce high blood pressure (hypertension). It may also reduce your risk for type 2 diabetes, heart disease, and stroke.  With the DASH eating plan, you should limit salt (sodium) intake to 2,300 mg a day. If you have hypertension, you may need to reduce your sodium intake to 1,500 mg a day.  When on the DASH eating plan, aim to eat more fresh fruits and vegetables, whole grains, lean proteins, low-fat dairy, and heart-healthy fats.  Work with your health care provider or diet and nutrition specialist (dietitian) to adjust your eating plan to your individual   calorie needs. This information is not intended to replace advice given to you by your health care provider. Make sure you discuss any questions you have with your health care provider. Document Released: 12/12/2010 Document Revised: 12/17/2015 Document Reviewed: 12/17/2015 Elsevier Interactive Patient Education  2017 Elsevier Inc.  

## 2016-07-15 LAB — LIPID PANEL
Chol/HDL Ratio: 4.6 ratio (ref 0.0–5.0)
Cholesterol, Total: 252 mg/dL — ABNORMAL HIGH (ref 100–199)
HDL: 55 mg/dL (ref >39)
LDL Calculated: 176 mg/dL — ABNORMAL HIGH (ref 0–99)
Triglycerides: 105 mg/dL (ref 0–149)
VLDL Cholesterol Cal: 21 mg/dL (ref 5–40)

## 2016-07-17 ENCOUNTER — Telehealth: Payer: Self-pay

## 2016-07-17 ENCOUNTER — Other Ambulatory Visit: Payer: Self-pay | Admitting: Family Medicine

## 2016-07-17 DIAGNOSIS — E782 Mixed hyperlipidemia: Secondary | ICD-10-CM

## 2016-07-17 MED ORDER — ATORVASTATIN CALCIUM 40 MG PO TABS: 40.0000 mg | ORAL_TABLET | Freq: Every day | ORAL | 3 refills | Status: DC

## 2016-07-17 NOTE — Telephone Encounter (Signed)
CMA call regarding lab results   Patient did not answer but left a VM stating the reason of the call &  to call me back  

## 2016-07-17 NOTE — Telephone Encounter (Signed)
-----   Message from Alfonse Spruce, [place] sent at 07/17/2016 10:33 AM EDT ----- Lipid levels were elevated. This can increase your risk of heart disease overtime.Your dose of atorvastatin will be increased. Start eating a diet low in saturated fat. Limit your intake of fried foods, red meats, and whole milk. Increase activity.

## 2016-08-11 ENCOUNTER — Other Ambulatory Visit: Payer: Self-pay | Admitting: Physician Assistant

## 2016-08-11 DIAGNOSIS — M25511 Pain in right shoulder: Secondary | ICD-10-CM

## 2016-08-18 ENCOUNTER — Other Ambulatory Visit: Payer: Medicare HMO

## 2016-10-14 ENCOUNTER — Ambulatory Visit: Payer: Medicare HMO | Attending: Family Medicine | Admitting: Family Medicine

## 2016-10-14 ENCOUNTER — Encounter: Payer: Self-pay | Admitting: Family Medicine

## 2016-10-14 VITALS — BP 129/70 | HR 73 | Temp 98.3°F | Resp 18 | Ht 69.0 in | Wt 177.6 lb

## 2016-10-14 DIAGNOSIS — E782 Mixed hyperlipidemia: Secondary | ICD-10-CM | POA: Insufficient documentation

## 2016-10-14 DIAGNOSIS — I10 Essential (primary) hypertension: Secondary | ICD-10-CM | POA: Diagnosis not present

## 2016-10-14 DIAGNOSIS — L819 Disorder of pigmentation, unspecified: Secondary | ICD-10-CM | POA: Insufficient documentation

## 2016-10-14 DIAGNOSIS — Z79899 Other long term (current) drug therapy: Secondary | ICD-10-CM | POA: Diagnosis not present

## 2016-10-14 DIAGNOSIS — Z23 Encounter for immunization: Secondary | ICD-10-CM | POA: Insufficient documentation

## 2016-10-14 MED ORDER — LOSARTAN POTASSIUM 25 MG PO TABS: 25.0000 mg | ORAL_TABLET | Freq: Every day | ORAL | 1 refills | Status: DC

## 2016-10-14 MED ORDER — AMBI FADE 2 % EX CREA: 1.0000 "application " | TOPICAL_CREAM | Freq: Every day | CUTANEOUS | Status: DC | PRN

## 2016-10-14 MED ORDER — SUNSCREEN SPF30 EX LOTN: TOPICAL_LOTION | CUTANEOUS | Status: DC

## 2016-10-14 MED ORDER — AMLODIPINE BESYLATE 5 MG PO TABS: 2.5000 mg | ORAL_TABLET | Freq: Every day | ORAL | 1 refills | Status: DC

## 2016-10-14 MED ORDER — ATORVASTATIN CALCIUM 40 MG PO TABS: 40.0000 mg | ORAL_TABLET | Freq: Every day | ORAL | 3 refills | Status: DC

## 2016-10-14 NOTE — Patient Instructions (Signed)
DASH Eating Plan DASH stands for "Dietary Approaches to Stop Hypertension." The DASH eating plan is a healthy eating plan that has been shown to reduce high blood pressure (hypertension). It may also reduce your risk for type 2 diabetes, heart disease, and stroke. The DASH eating plan may also help with weight loss. What are tips for following this plan? General guidelines  Avoid eating more than 2,300 mg (milligrams) of salt (sodium) a day. If you have hypertension, you may need to reduce your sodium intake to 1,500 mg a day.  Limit alcohol intake to no more than 1 drink a day for nonpregnant women and 2 drinks a day for men. One drink equals 12 oz of beer, 5 oz of wine, or 1 oz of hard liquor.  Work with your health care provider to maintain a healthy body weight or to lose weight. Ask what an ideal weight is for you.  Get at least 30 minutes of exercise that causes your heart to beat faster (aerobic exercise) most days of the week. Activities may include walking, swimming, or biking.  Work with your health care provider or diet and nutrition specialist (dietitian) to adjust your eating plan to your individual calorie needs. Reading food labels  Check food labels for the amount of sodium per serving. Choose foods with less than 5 percent of the Daily Value of sodium. Generally, foods with less than 300 mg of sodium per serving fit into this eating plan.  To find whole grains, look for the word "whole" as the first word in the ingredient list. Shopping  Buy products labeled as "low-sodium" or "no salt added."  Buy fresh foods. Avoid canned foods and premade or frozen meals. Cooking  Avoid adding salt when cooking. Use salt-free seasonings or herbs instead of table salt or sea salt. Check with your health care provider or pharmacist before using salt substitutes.  Do not fry foods. Cook foods using healthy methods such as baking, boiling, grilling, and broiling instead.  Cook with  heart-healthy oils, such as olive, canola, soybean, or sunflower oil. Meal planning   Eat a balanced diet that includes: ? 5 or more servings of fruits and vegetables each day. At each meal, try to fill half of your plate with fruits and vegetables. ? Up to 6-8 servings of whole grains each day. ? Less than 6 oz of lean meat, poultry, or fish each day. A 3-oz serving of meat is about the same size as a deck of cards. One egg equals 1 oz. ? 2 servings of low-fat dairy each day. ? A serving of nuts, seeds, or beans 5 times each week. ? Heart-healthy fats. Healthy fats called Omega-3 fatty acids are found in foods such as flaxseeds and coldwater fish, like sardines, salmon, and mackerel.  Limit how much you eat of the following: ? Canned or prepackaged foods. ? Food that is high in trans fat, such as fried foods. ? Food that is high in saturated fat, such as fatty meat. ? Sweets, desserts, sugary drinks, and other foods with added sugar. ? Full-fat dairy products.  Do not salt foods before eating.  Try to eat at least 2 vegetarian meals each week.  Eat more home-cooked food and less restaurant, buffet, and fast food.  When eating at a restaurant, ask that your food be prepared with less salt or no salt, if possible. What foods are recommended? The items listed may not be a complete list. Talk with your dietitian about what   dietary choices are best for you. Grains Whole-grain or whole-wheat bread. Whole-grain or whole-wheat pasta. Brown rice. Oatmeal. Quinoa. Bulgur. Whole-grain and low-sodium cereals. Pita bread. Low-fat, low-sodium crackers. Whole-wheat flour tortillas. Vegetables Fresh or frozen vegetables (raw, steamed, roasted, or grilled). Low-sodium or reduced-sodium tomato and vegetable juice. Low-sodium or reduced-sodium tomato sauce and tomato paste. Low-sodium or reduced-sodium canned vegetables. Fruits All fresh, dried, or frozen fruit. Canned fruit in natural juice (without  added sugar). Meat and other protein foods Skinless chicken or turkey. Ground chicken or turkey. Pork with fat trimmed off. Fish and seafood. Egg whites. Dried beans, peas, or lentils. Unsalted nuts, nut butters, and seeds. Unsalted canned beans. Lean cuts of beef with fat trimmed off. Low-sodium, lean deli meat. Dairy Low-fat (1%) or fat-free (skim) milk. Fat-free, low-fat, or reduced-fat cheeses. Nonfat, low-sodium ricotta or cottage cheese. Low-fat or nonfat yogurt. Low-fat, low-sodium cheese. Fats and oils Soft margarine without trans fats. Vegetable oil. Low-fat, reduced-fat, or light mayonnaise and salad dressings (reduced-sodium). Canola, safflower, olive, soybean, and sunflower oils. Avocado. Seasoning and other foods Herbs. Spices. Seasoning mixes without salt. Unsalted popcorn and pretzels. Fat-free sweets. What foods are not recommended? The items listed may not be a complete list. Talk with your dietitian about what dietary choices are best for you. Grains Baked goods made with fat, such as croissants, muffins, or some breads. Dry pasta or rice meal packs. Vegetables Creamed or fried vegetables. Vegetables in a cheese sauce. Regular canned vegetables (not low-sodium or reduced-sodium). Regular canned tomato sauce and paste (not low-sodium or reduced-sodium). Regular tomato and vegetable juice (not low-sodium or reduced-sodium). Pickles. Olives. Fruits Canned fruit in a light or heavy syrup. Fried fruit. Fruit in cream or butter sauce. Meat and other protein foods Fatty cuts of meat. Ribs. Fried meat. Bacon. Sausage. Bologna and other processed lunch meats. Salami. Fatback. Hotdogs. Bratwurst. Salted nuts and seeds. Canned beans with added salt. Canned or smoked fish. Whole eggs or egg yolks. Chicken or turkey with skin. Dairy Whole or 2% milk, cream, and half-and-half. Whole or full-fat cream cheese. Whole-fat or sweetened yogurt. Full-fat cheese. Nondairy creamers. Whipped toppings.  Processed cheese and cheese spreads. Fats and oils Butter. Stick margarine. Lard. Shortening. Ghee. Bacon fat. Tropical oils, such as coconut, palm kernel, or palm oil. Seasoning and other foods Salted popcorn and pretzels. Onion salt, garlic salt, seasoned salt, table salt, and sea salt. Worcestershire sauce. Tartar sauce. Barbecue sauce. Teriyaki sauce. Soy sauce, including reduced-sodium. Steak sauce. Canned and packaged gravies. Fish sauce. Oyster sauce. Cocktail sauce. Horseradish that you find on the shelf. Ketchup. Mustard. Meat flavorings and tenderizers. Bouillon cubes. Hot sauce and Tabasco sauce. Premade or packaged marinades. Premade or packaged taco seasonings. Relishes. Regular salad dressings. Where to find more information:  National Heart, Lung, and Blood Institute: www.nhlbi.nih.gov  American Heart Association: www.heart.org Summary  The DASH eating plan is a healthy eating plan that has been shown to reduce high blood pressure (hypertension). It may also reduce your risk for type 2 diabetes, heart disease, and stroke.  With the DASH eating plan, you should limit salt (sodium) intake to 2,300 mg a day. If you have hypertension, you may need to reduce your sodium intake to 1,500 mg a day.  When on the DASH eating plan, aim to eat more fresh fruits and vegetables, whole grains, lean proteins, low-fat dairy, and heart-healthy fats.  Work with your health care provider or diet and nutrition specialist (dietitian) to adjust your eating plan to your individual   calorie needs. This information is not intended to replace advice given to you by your health care provider. Make sure you discuss any questions you have with your health care provider. Document Released: 12/12/2010 Document Revised: 12/17/2015 Document Reviewed: 12/17/2015 Elsevier Interactive Patient Education  2017 Elsevier Inc.  

## 2016-10-14 NOTE — Progress Notes (Signed)
Patient is here for f/up   Patient has no pain

## 2016-10-14 NOTE — Progress Notes (Signed)
Subjective:  Patient ID: Timothy Evans, male    DOB: July 29, 1940  Age: 76 y.o. MRN: [id number]  CC: Hypertension   HPI FADI MENTER presents for history of hypertension. He is not exercising and is adherent to low salt diet.  He does not check BP at home. Cardiac symptoms none. Patient denies chest pain, chest pressure/discomfort, claudication, dyspnea, lower extremity edema, near-syncope, palpitations and syncope.  Cardiovascular risk factors: advanced age (older than 41 for men, 67 for women), dyslipidemia, hypertension, male gender and sedentary lifestyle. Use of agents associated with hypertension: none. History of target organ damage: none. He complains of hyperpigmentation. left upper or related to history of school radiation from rash.    Outpatient Medications Prior to Visit  Medication Sig Dispense Refill  . amLODipine (NORVASC) 5 MG tablet Take 0.5 tablets (2.5 mg total) by mouth daily. 90 tablet 0  . atorvastatin (LIPITOR) 40 MG tablet Take 1 tablet (40 mg total) by mouth daily. 90 tablet 3  . losartan (COZAAR) 25 MG tablet Take 1 tablet (25 mg total) by mouth daily. 90 tablet 0  . albuterol (PROVENTIL HFA;VENTOLIN HFA) 108 (90 BASE) MCG/ACT inhaler Inhale 2 puffs into the lungs every 6 (six) hours as needed for wheezing or shortness of breath. 1 Inhaler 2  . hydrOXYzine (ATARAX/VISTARIL) 50 MG tablet Take 1 tablet (50 mg total) by mouth every 8 (eight) hours as needed. 30 tablet 0  . ibuprofen (ADVIL,MOTRIN) 600 MG tablet Take 1 tablet (600 mg total) by mouth every 6 (six) hours as needed for moderate pain (Take with food.). 30 tablet 0  . levofloxacin (LEVAQUIN) 750 MG tablet Take 1 tablet (750 mg total) by mouth daily. (Patient not taking: Reported on 04/08/2014) 7 tablet 0  . neomycin-bacitracin-polymyxin (NEOSPORIN) ointment Apply 1 application topically as needed for wound care. Apply to affected areas. 15 g 0  . triamcinolone (KENALOG) 0.025 % ointment Apply 1 application  topically 2 (two) times daily as needed. Apply to affected areas. 30 g 1  . Vitamin D, Ergocalciferol, (DRISDOL) 50000 UNITS CAPS capsule Take 1 capsule by mouth once a week. Every thursday  5   No facility-administered medications prior to visit.     ROS Review of Systems  Constitutional: Negative.   Respiratory: Negative.   Cardiovascular: Negative.   Gastrointestinal: Negative.   Skin: Positive for color change ( blemishes to left upper extremity).    Objective:  BP 129/70 (BP Location: Left Arm, Patient Position: Sitting, Cuff Size: Normal)   Pulse 73   Temp 98.3 F (36.8 C) (Oral)   Resp 18   Ht 5\' 9"  (1.753 m)   Wt 177 lb 9.6 oz (80.6 kg)   SpO2 98%   BMI 26.23 kg/m   BP/Weight 10/14/2016 03/06/5398 08/11/7617  Systolic BP 509 326 712  Diastolic BP 70 73 74  Wt. (Lbs) 177.6 179.8 -  BMI 26.23 26.55 -    Physical Exam  Constitutional: He appears well-developed and well-nourished.  Neck: No JVD present.  Cardiovascular: Normal rate, regular rhythm, normal heart sounds and intact distal pulses.   Pulmonary/Chest: Effort normal and breath sounds normal.  Abdominal: Soft. Bowel sounds are normal. There is no tenderness.  Skin: Skin is warm and dry.  Areas of darkened brown hyperpigmentation to left upper extremity.  Nursing note and vitals reviewed.  Assessment & Plan:   1. Essential hypertension  - amLODipine (NORVASC) 5 MG tablet; Take 0.5 tablets (2.5 mg total) by mouth daily.  Dispense:  90 tablet; Refill: 1 - losartan (COZAAR) 25 MG tablet; Take 1 tablet (25 mg total) by mouth daily.  Dispense: 90 tablet; Refill: 1  2. Mixed hyperlipidemia  - atorvastatin (LIPITOR) 40 MG tablet; Take 1 tablet (40 mg total) by mouth daily.  Dispense: 90 tablet; Refill: 3  3. Hyperpigmentation  - Hydroquinone-Sunscreens (AMBI FADE) 2 % CREA; Apply 1 application topically daily as needed. Use as directed. - SUNSCREEN SPF30 LOTN; Apply one application topically daily as needed.  Use as directed.  4. Needs flu shot  - Flu Vaccine QUAD 6+ mos PF IM (Fluarix Quad PF)   Meds ordered this encounter  Medications  . atorvastatin (LIPITOR) 40 MG tablet    Sig: Take 1 tablet (40 mg total) by mouth daily.    Dispense:  90 tablet    Refill:  3    Order Specific Question:   Supervising Provider    Answer:   Tresa Garter [id number]  . amLODipine (NORVASC) 5 MG tablet    Sig: Take 0.5 tablets (2.5 mg total) by mouth daily.    Dispense:  90 tablet    Refill:  1    Order Specific Question:   Supervising Provider    Answer:   Tresa Garter [id number]  . losartan (COZAAR) 25 MG tablet    Sig: Take 1 tablet (25 mg total) by mouth daily.    Dispense:  90 tablet    Refill:  1    Order Specific Question:   Supervising Provider    Answer:   Tresa Garter [id number]  . Hydroquinone-Sunscreens (AMBI FADE) 2 % CREA    Sig: Apply 1 application topically daily as needed. Use as directed.    Order Specific Question:   Supervising Provider    Answer:   Tresa Garter [id number]  . SUNSCREEN SPF30 LOTN    Sig: Apply one application topically daily as needed. Use as directed.    Order Specific Question:   Supervising Provider    Answer:   Tresa Garter [[id number]]    Follow-up: Return in about 6 months (around 04/14/2017), or if symptoms worsen or fail to improve, for HTN/HLD.   Alfonse Spruce FNP

## 2017-02-02 ENCOUNTER — Ambulatory Visit: Payer: Medicare HMO | Admitting: Family Medicine

## 2018-08-30 DIAGNOSIS — H04123 Dry eye syndrome of bilateral lacrimal glands: Secondary | ICD-10-CM | POA: Insufficient documentation

## 2018-08-30 DIAGNOSIS — H5213 Myopia, bilateral: Secondary | ICD-10-CM | POA: Insufficient documentation

## 2018-08-30 DIAGNOSIS — H524 Presbyopia: Secondary | ICD-10-CM | POA: Insufficient documentation

## 2018-08-30 DIAGNOSIS — H11022 Central pterygium of left eye: Secondary | ICD-10-CM | POA: Insufficient documentation

## 2018-08-30 DIAGNOSIS — H26493 Other secondary cataract, bilateral: Secondary | ICD-10-CM | POA: Insufficient documentation

## 2018-09-24 ENCOUNTER — Other Ambulatory Visit: Payer: Self-pay

## 2018-09-24 DIAGNOSIS — Z20822 Contact with and (suspected) exposure to covid-19: Secondary | ICD-10-CM

## 2018-09-25 LAB — NOVEL CORONAVIRUS, NAA: SARS-CoV-2, NAA: NOT DETECTED

## 2018-12-14 DIAGNOSIS — N5201 Erectile dysfunction due to arterial insufficiency: Secondary | ICD-10-CM | POA: Diagnosis not present

## 2018-12-14 DIAGNOSIS — R972 Elevated prostate specific antigen [PSA]: Secondary | ICD-10-CM | POA: Diagnosis not present

## 2019-02-01 ENCOUNTER — Ambulatory Visit: Payer: Medicare HMO

## 2019-02-10 ENCOUNTER — Ambulatory Visit: Payer: Medicare HMO | Attending: Internal Medicine

## 2019-02-10 DIAGNOSIS — Z23 Encounter for immunization: Secondary | ICD-10-CM | POA: Insufficient documentation

## 2019-02-10 NOTE — Progress Notes (Signed)
   Covid-19 Vaccination Clinic  Name:  Timothy Evans    MRN: [id number] DOB: 09/15/1940  02/10/2019  [name] was observed post Covid-19 immunization for 15 minutes without incidence. He was provided with Vaccine Information Sheet and instruction to access the V-Safe system.   [name] was instructed to call 911 with any severe reactions post vaccine: Marland Kitchen Difficulty breathing  . Swelling of your face and throat  . A fast heartbeat  . A bad rash all over your body  . Dizziness and weakness    Immunizations Administered    Name Date Dose VIS Date Route   Pfizer COVID-19 Vaccine 02/10/2019  2:33 PM 0.3 mL 12/17/2018 Intramuscular   Manufacturer: [place]   Lot: [id number]   [place]: [id number]

## 2019-02-18 ENCOUNTER — Ambulatory Visit: Payer: Medicare HMO

## 2019-03-07 ENCOUNTER — Ambulatory Visit: Payer: Medicare HMO | Attending: Internal Medicine

## 2019-03-07 DIAGNOSIS — Z23 Encounter for immunization: Secondary | ICD-10-CM | POA: Insufficient documentation

## 2019-03-07 NOTE — Progress Notes (Signed)
   Covid-19 Vaccination Clinic  Name:  Timothy Evans    MRN: [id number] DOB: 05/16/1940  03/07/2019  [name] was observed post Covid-19 immunization for 15 minutes without incidence. He was provided with Vaccine Information Sheet and instruction to access the V-Safe system.   Mr. Trigger was instructed to call 911 with any severe reactions post vaccine: Marland Kitchen Difficulty breathing  . Swelling of your face and throat  . A fast heartbeat  . A bad rash all over your body  . Dizziness and weakness    Immunizations Administered    Name Date Dose VIS Date Route   Pfizer COVID-19 Vaccine 03/07/2019  4:58 PM 0.3 mL 12/17/2018 Intramuscular   Manufacturer: [place]   Lot: [id number]   [employer]: [id number]

## 2019-03-14 DIAGNOSIS — R972 Elevated prostate specific antigen [PSA]: Secondary | ICD-10-CM | POA: Diagnosis not present

## 2019-03-29 DIAGNOSIS — H11052 Peripheral pterygium, progressive, left eye: Secondary | ICD-10-CM | POA: Diagnosis not present

## 2019-03-29 DIAGNOSIS — H401133 Primary open-angle glaucoma, bilateral, severe stage: Secondary | ICD-10-CM | POA: Diagnosis not present

## 2019-03-30 DIAGNOSIS — J4530 Mild persistent asthma, uncomplicated: Secondary | ICD-10-CM | POA: Diagnosis not present

## 2019-03-30 DIAGNOSIS — J449 Chronic obstructive pulmonary disease, unspecified: Secondary | ICD-10-CM | POA: Diagnosis not present

## 2019-03-30 DIAGNOSIS — Z1152 Encounter for screening for COVID-19: Secondary | ICD-10-CM | POA: Diagnosis not present

## 2019-03-30 DIAGNOSIS — J209 Acute bronchitis, unspecified: Secondary | ICD-10-CM | POA: Diagnosis not present

## 2019-03-30 DIAGNOSIS — R05 Cough: Secondary | ICD-10-CM | POA: Diagnosis not present

## 2019-08-19 DIAGNOSIS — M79644 Pain in right finger(s): Secondary | ICD-10-CM | POA: Insufficient documentation

## 2019-11-01 DIAGNOSIS — I10 Essential (primary) hypertension: Secondary | ICD-10-CM | POA: Diagnosis not present

## 2019-11-01 DIAGNOSIS — Z125 Encounter for screening for malignant neoplasm of prostate: Secondary | ICD-10-CM | POA: Diagnosis not present

## 2019-11-01 DIAGNOSIS — E7800 Pure hypercholesterolemia, unspecified: Secondary | ICD-10-CM | POA: Diagnosis not present

## 2019-11-07 DIAGNOSIS — I10 Essential (primary) hypertension: Secondary | ICD-10-CM | POA: Diagnosis not present

## 2019-11-07 DIAGNOSIS — M1990 Unspecified osteoarthritis, unspecified site: Secondary | ICD-10-CM | POA: Diagnosis not present

## 2019-11-07 DIAGNOSIS — J4530 Mild persistent asthma, uncomplicated: Secondary | ICD-10-CM | POA: Diagnosis not present

## 2019-11-07 DIAGNOSIS — J449 Chronic obstructive pulmonary disease, unspecified: Secondary | ICD-10-CM | POA: Diagnosis not present

## 2019-11-07 DIAGNOSIS — E7800 Pure hypercholesterolemia, unspecified: Secondary | ICD-10-CM | POA: Diagnosis not present

## 2019-11-07 DIAGNOSIS — R509 Fever, unspecified: Secondary | ICD-10-CM | POA: Diagnosis not present

## 2019-11-07 DIAGNOSIS — R82998 Other abnormal findings in urine: Secondary | ICD-10-CM | POA: Diagnosis not present

## 2019-11-07 DIAGNOSIS — R059 Cough, unspecified: Secondary | ICD-10-CM | POA: Diagnosis not present

## 2019-11-07 DIAGNOSIS — R972 Elevated prostate specific antigen [PSA]: Secondary | ICD-10-CM | POA: Diagnosis not present

## 2019-11-07 DIAGNOSIS — Z0000 Encounter for general adult medical examination without abnormal findings: Secondary | ICD-10-CM | POA: Diagnosis not present

## 2019-11-17 DIAGNOSIS — R059 Cough, unspecified: Secondary | ICD-10-CM | POA: Diagnosis not present

## 2019-11-17 DIAGNOSIS — J40 Bronchitis, not specified as acute or chronic: Secondary | ICD-10-CM | POA: Diagnosis not present

## 2019-11-17 DIAGNOSIS — J449 Chronic obstructive pulmonary disease, unspecified: Secondary | ICD-10-CM | POA: Diagnosis not present

## 2019-11-17 DIAGNOSIS — J4530 Mild persistent asthma, uncomplicated: Secondary | ICD-10-CM | POA: Diagnosis not present

## 2019-11-17 DIAGNOSIS — I10 Essential (primary) hypertension: Secondary | ICD-10-CM | POA: Diagnosis not present

## 2019-11-30 DIAGNOSIS — Z1212 Encounter for screening for malignant neoplasm of rectum: Secondary | ICD-10-CM | POA: Diagnosis not present

## 2019-12-06 DIAGNOSIS — H52212 Irregular astigmatism, left eye: Secondary | ICD-10-CM | POA: Diagnosis not present

## 2019-12-06 DIAGNOSIS — H401133 Primary open-angle glaucoma, bilateral, severe stage: Secondary | ICD-10-CM | POA: Diagnosis not present

## 2019-12-06 DIAGNOSIS — H11052 Peripheral pterygium, progressive, left eye: Secondary | ICD-10-CM | POA: Diagnosis not present

## 2019-12-06 DIAGNOSIS — Z961 Presence of intraocular lens: Secondary | ICD-10-CM | POA: Diagnosis not present

## 2020-01-10 DIAGNOSIS — R3912 Poor urinary stream: Secondary | ICD-10-CM | POA: Diagnosis not present

## 2020-01-10 DIAGNOSIS — R972 Elevated prostate specific antigen [PSA]: Secondary | ICD-10-CM | POA: Diagnosis not present

## 2020-01-10 DIAGNOSIS — N403 Nodular prostate with lower urinary tract symptoms: Secondary | ICD-10-CM | POA: Diagnosis not present

## 2020-01-10 DIAGNOSIS — N5201 Erectile dysfunction due to arterial insufficiency: Secondary | ICD-10-CM | POA: Diagnosis not present

## 2020-02-14 ENCOUNTER — Other Ambulatory Visit: Payer: Self-pay | Admitting: Internal Medicine

## 2020-02-14 DIAGNOSIS — E7849 Other hyperlipidemia: Secondary | ICD-10-CM | POA: Diagnosis not present

## 2020-02-14 DIAGNOSIS — Z0000 Encounter for general adult medical examination without abnormal findings: Secondary | ICD-10-CM | POA: Diagnosis not present

## 2020-02-14 DIAGNOSIS — R972 Elevated prostate specific antigen [PSA]: Secondary | ICD-10-CM | POA: Diagnosis not present

## 2020-02-14 DIAGNOSIS — Z131 Encounter for screening for diabetes mellitus: Secondary | ICD-10-CM | POA: Diagnosis not present

## 2020-02-15 LAB — COMPLETE METABOLIC PANEL WITH GFR
AG Ratio: 1.7 (calc) (ref 1.0–2.5)
ALT: 18 U/L (ref 9–46)
AST: 18 U/L (ref 10–35)
Albumin: 4.5 g/dL (ref 3.6–5.1)
Alkaline phosphatase (APISO): 51 U/L (ref 35–144)
BUN: 11 mg/dL (ref 7–25)
CO2: 24 mmol/L (ref 20–32)
Calcium: 9.6 mg/dL (ref 8.6–10.3)
Chloride: 107 mmol/L (ref 98–110)
Creat: 1.08 mg/dL (ref 0.70–1.18)
GFR, Est African American: 76 mL/min/{1.73_m2} (ref >=60)
GFR, Est Non African American: 66 mL/min/{1.73_m2} (ref >=60)
Globulin: 2.7 g/dL (calc) (ref 1.9–3.7)
Glucose, Bld: 77 mg/dL (ref 65–99)
Potassium: 4.5 mmol/L (ref 3.5–5.3)
Sodium: 141 mmol/L (ref 135–146)
Total Bilirubin: 0.7 mg/dL (ref 0.2–1.2)
Total Protein: 7.2 g/dL (ref 6.1–8.1)

## 2020-02-15 LAB — CBC
HCT: 46.6 % (ref 38.5–50.0)
Hemoglobin: 15.7 g/dL (ref 13.2–17.1)
MCH: 29.5 pg (ref 27.0–33.0)
MCHC: 33.7 g/dL (ref 32.0–36.0)
MCV: 87.4 fL (ref 80.0–100.0)
MPV: 10.6 fL (ref 7.5–12.5)
Platelets: 228 10*3/uL (ref 140–400)
RBC: 5.33 10*6/uL (ref 4.20–5.80)
RDW: 14.9 % (ref 11.0–15.0)
WBC: 7 10*3/uL (ref 3.8–10.8)

## 2020-02-15 LAB — LIPID PANEL
Cholesterol: 191 mg/dL (ref <200)
HDL: 59 mg/dL (ref >=40)
LDL Cholesterol (Calc): 115 mg/dL (calc) — ABNORMAL HIGH
Non-HDL Cholesterol (Calc): 132 mg/dL (calc) — ABNORMAL HIGH (ref <130)
Total CHOL/HDL Ratio: 3.2 (calc) (ref <5.0)
Triglycerides: 71 mg/dL (ref <150)

## 2020-02-15 LAB — TSH: TSH: 1.4 mIU/L (ref 0.40–4.50)

## 2020-02-28 DIAGNOSIS — C61 Malignant neoplasm of prostate: Secondary | ICD-10-CM | POA: Diagnosis not present

## 2020-02-28 DIAGNOSIS — R972 Elevated prostate specific antigen [PSA]: Secondary | ICD-10-CM | POA: Diagnosis not present

## 2020-03-08 DIAGNOSIS — N3000 Acute cystitis without hematuria: Secondary | ICD-10-CM | POA: Diagnosis not present

## 2020-03-08 DIAGNOSIS — C61 Malignant neoplasm of prostate: Secondary | ICD-10-CM | POA: Diagnosis not present

## 2020-03-27 DIAGNOSIS — E7849 Other hyperlipidemia: Secondary | ICD-10-CM | POA: Diagnosis not present

## 2020-03-27 DIAGNOSIS — M179 Osteoarthritis of knee, unspecified: Secondary | ICD-10-CM | POA: Diagnosis not present

## 2020-03-27 DIAGNOSIS — C61 Malignant neoplasm of prostate: Secondary | ICD-10-CM | POA: Diagnosis not present

## 2020-03-27 DIAGNOSIS — J302 Other seasonal allergic rhinitis: Secondary | ICD-10-CM | POA: Diagnosis not present

## 2020-04-05 ENCOUNTER — Encounter: Payer: Self-pay | Admitting: Radiation Oncology

## 2020-04-05 NOTE — Progress Notes (Signed)
GU Location of Tumor / Histology: prostatic adenocarcinoma  If Prostate Cancer, Gleason Score is (3 + 4) and PSA is (7.7). Prostate volume: 40.25 grams  Timothy Evans was initially seen by Dr. Karsten Ro in December 202 for an elevated PSA of 5.2. Patient returned in October 2021 with a psa of 7.7.  Biopsies of prostate (if applicable) revealed:    Past/Anticipated interventions by urology, if any: prostate biopsy, referral to Dr. Tammi Klippel to discuss radiation treatment options.  Past/Anticipated interventions by medical oncology, if any: no  Weight changes, if any: denies  Bowel/Bladder complaints, if any: IPSS 10. SHIM 16.Reports a weak urine stream. Denies dysuria or hematuria.  Denies urinary leakage or incontinence. Denies any bowel complaints.  Nausea/Vomiting, if any: denies  Pain issues, if any:  denies  SAFETY ISSUES:  Prior radiation? denies  Pacemaker/ICD? denies  Possible current pregnancy? no, male patient  Is the patient on methotrexate? denies  Current Complaints / other details:  80 year old male. Married with one son and one daughter.

## 2020-04-06 ENCOUNTER — Ambulatory Visit
Admission: RE | Admit: 2020-04-06 | Discharge: 2020-04-06 | Disposition: A | Discharge status: Home / Self Care | Payer: Medicare HMO | Source: Ambulatory Visit | Attending: Radiation Oncology | Admitting: Radiation Oncology

## 2020-04-06 ENCOUNTER — Other Ambulatory Visit: Payer: Self-pay

## 2020-04-06 ENCOUNTER — Encounter: Payer: Self-pay | Admitting: Radiation Oncology

## 2020-04-06 ENCOUNTER — Encounter: Payer: Self-pay | Admitting: Medical Oncology

## 2020-04-06 VITALS — BP 133/78 | HR 83 | Temp 97.1°F | Resp 17 | Ht 69.0 in | Wt 170.0 lb

## 2020-04-06 DIAGNOSIS — E7800 Pure hypercholesterolemia, unspecified: Secondary | ICD-10-CM | POA: Insufficient documentation

## 2020-04-06 DIAGNOSIS — C61 Malignant neoplasm of prostate: Secondary | ICD-10-CM | POA: Diagnosis not present

## 2020-04-06 DIAGNOSIS — Z87891 Personal history of nicotine dependence: Secondary | ICD-10-CM | POA: Insufficient documentation

## 2020-04-06 DIAGNOSIS — R972 Elevated prostate specific antigen [PSA]: Secondary | ICD-10-CM | POA: Diagnosis not present

## 2020-04-06 DIAGNOSIS — Z79899 Other long term (current) drug therapy: Secondary | ICD-10-CM | POA: Insufficient documentation

## 2020-04-06 HISTORY — DX: Malignant neoplasm of prostate: C61

## 2020-04-06 NOTE — Progress Notes (Signed)
Introduced myself to patient as the prostate nurse navigator and discussed my role. He is here to discuss his radiation therapy treatment options. He is leaning towards brachytherapy. I gave him my business card and asked him to call me with questions or concerns. He voiced understanding.

## 2020-04-06 NOTE — Progress Notes (Signed)
Radiation Oncology         [phone] ________________________________  Initial outpatient Consultation  Name: Timothy Evans MRN: [id number]  Date: 04/06/2020  DOB: 15-Dec-1940  [name], Timothy Grief, MD  Timothy Lima, MD   REFERRING PHYSICIAN: [name], MD  DIAGNOSIS: 80 y.o. gentleman with Stage T1c adenocarcinoma of the prostate with Gleason score of 3+4, and PSA of 7.7.    ICD-10-CM   1. Malignant neoplasm of prostate ([place])  C61     HISTORY OF PRESENT ILLNESS: Timothy Evans is a 80 y.o. male with a diagnosis of prostate cancer. He was initially referred to Dr. Karsten Evans in 12/2018 for an elevated PSA of 5.2. DRE was without concerning findings so the decision was to monitor the PSA and this was done with his PCP.  A repeat PSA on 03/15/2019 was 4.67 but increased to 7.7 on 11/01/19.  Accordingly, he was referred back to [facility] and met in consult with Dr. Abner Evans on 01/10/20. Digital rectal examination was performed at that time revealing no nodules.  The patient proceeded to transrectal ultrasound with 12 biopsies of the prostate on 02/28/20.  The prostate volume measured 40.25 cc.  Out of 12 core biopsies, 3 were positive.  The maximum Gleason score was 3+4, and this was seen in the right base and right base lateral. Additionally, Gleason 3+3 was seen in the right apex lateral.  The patient reviewed the biopsy results with his urologist and he has kindly been referred today for discussion of potential radiation treatment options.   PREVIOUS RADIATION THERAPY: No  PAST MEDICAL HISTORY:  Past Medical History:  Diagnosis Date  . High cholesterol   . Prostate cancer ([place])       PAST SURGICAL HISTORY: Past Surgical History:  Procedure Laterality Date  . NECK SURGERY    . PROSTATE BIOPSY      FAMILY HISTORY:  Family History  Problem Relation Age of Onset  . Diabetes Mellitus II Neg Hx   . Prostate cancer Neg Hx   . Colon cancer Neg Hx   . Breast cancer Neg Hx   .  Pancreatic cancer Neg Hx     SOCIAL HISTORY:  Social History   Socioeconomic History  . Marital status: Married    Spouse name: Not on file  . Number of children: 2  . Years of education: Not on file  . Highest education level: Not on file  Occupational History  . Not on file  Tobacco Use  . Smoking status: Former Smoker    Packs/day: 0.50    Years: 20.00    Pack years: 10.00    Types: Cigarettes    Quit date: 01/06/2005    Years since quitting: 15.2  . Smokeless tobacco: Never Used  Vaping Use  . Vaping Use: Never used  Substance and Sexual Activity  . Alcohol use: Yes    Comment: occasional  . Drug use: Never  . Sexual activity: Not Currently  Other Topics Concern  . Not on file  Social History Narrative  . Not on file   Social Determinants of Health   Financial Resource Strain: Not on file  Food Insecurity: Not on file  Transportation Needs: Not on file  Physical Activity: Not on file  Stress: Not on file  Social Connections: Not on file  Intimate Partner Violence: Not on file    ALLERGIES: Patient has no known allergies.  MEDICATIONS:  Current Outpatient Medications  Medication Sig Dispense Refill  .  albuterol (PROVENTIL HFA;VENTOLIN HFA) 108 (90 BASE) MCG/ACT inhaler Inhale 2 puffs into the lungs every 6 (six) hours as needed for wheezing or shortness of breath. 1 Inhaler 2  . amLODipine (NORVASC) 5 MG tablet     . atorvastatin (LIPITOR) 40 MG tablet Take 1 tablet (40 mg total) by mouth daily. 90 tablet 3  . bimatoprost (LUMIGAN) 0.01 % SOLN Lumigan 0.01 % eye drops  INT 1 GTT IN OU HS    . dorzolamide-timolol (COSOPT) 22.3-6.8 MG/ML ophthalmic solution dorzolamide 22.3 mg-timolol 6.8 mg/mL eye drops  INSTILL 1 DROP IN BOTH EYES TWICE DAILY    . Hydroquinone-Sunscreens (AMBI FADE) 2 % CREA Apply 1 application topically daily as needed. Use as directed.    . hydrOXYzine (ATARAX/VISTARIL) 50 MG tablet Take 1 tablet (50 mg total) by mouth every 8 (eight)  hours as needed. 30 tablet 0  . losartan (COZAAR) 25 MG tablet Take 1 tablet (25 mg total) by mouth daily. 90 tablet 1  . meloxicam (MOBIC) 7.5 MG tablet meloxicam 7.5 mg tablet  TAKE 1 TABLET BY MOUTH EVERY DAY WITH MEALS FOR 14 DAYS    . SUNSCREEN SPF30 LOTN Apply one application topically daily as needed. Use as directed.    . TRELEGY ELLIPTA 100-62.5-25 MCG/INH AEPB     . triamcinolone (KENALOG) 0.025 % ointment Apply 1 application topically 2 (two) times daily as needed. Apply to affected areas. 30 g 1  . Vitamin D, Ergocalciferol, (DRISDOL) 50000 UNITS CAPS capsule Take 1 capsule by mouth once a week. Every thursday  5   No current facility-administered medications for this encounter.    REVIEW OF SYSTEMS:  On review of systems, the patient reports that he is doing well overall. He denies any chest pain, shortness of breath, cough, fevers, chills, night sweats, unintended weight changes. He denies any bowel disturbances, and denies abdominal pain, nausea or vomiting. He denies any new musculoskeletal or joint aches or pains. His IPSS was 10, indicating mild to moderate urinary symptoms. He reports a weak urine stream. His SHIM was 16, indicating he has moderate erectile dysfunction. A complete review of systems is obtained and is otherwise negative.    PHYSICAL EXAM:  Wt Readings from Last 3 Encounters:  04/06/20 170 lb (77.1 kg)  10/14/16 177 lb 9.6 oz (80.6 kg)  07/14/16 179 lb 12.8 oz (81.6 kg)   Temp Readings from Last 3 Encounters:  04/06/20 (!) 97.1 F (36.2 C) (Oral)  10/14/16 98.3 F (36.8 C) (Oral)  07/14/16 98.1 F (36.7 C) (Oral)   BP Readings from Last 3 Encounters:  04/06/20 133/78  10/14/16 129/70  07/14/16 135/73   Pulse Readings from Last 3 Encounters:  04/06/20 83  10/14/16 73  07/14/16 77   Pain Assessment Pain Score: 0-No pain/10  In general this is a well appearing african [place] male in no acute distress. He's alert and oriented x4 and  appropriate throughout the examination. Cardiopulmonary assessment is negative for acute distress, and he exhibits normal effort.     KPS = 100  100 - Normal; no complaints; no evidence of disease. 90   - Able to carry on normal activity; minor signs or symptoms of disease. 80   - Normal activity with effort; some signs or symptoms of disease. 48   - Cares for self; unable to carry on normal activity or to do active work. 60   - Requires occasional assistance, but is able to care for most of his personal needs.  50   - Requires considerable assistance and frequent medical care. 82   - Disabled; requires special care and assistance. 39   - Severely disabled; hospital admission is indicated although death not imminent. 21   - Very sick; hospital admission necessary; active supportive treatment necessary. 10   - Moribund; fatal processes progressing rapidly. 0     - Dead  Karnofsky DA, Abelmann [place], Craver LS and Burchenal [facility] [phone]) The use of the nitrogen mustards in the palliative treatment of carcinoma: with particular reference to bronchogenic carcinoma Cancer 1 634-56  LABORATORY DATA:  Lab Results  Component Value Date   WBC 7.0 02/14/2020   HGB 15.7 02/14/2020   HCT 46.6 02/14/2020   MCV 87.4 02/14/2020   PLT 228 02/14/2020   Lab Results  Component Value Date   NA 141 02/14/2020   K 4.5 02/14/2020   CL 107 02/14/2020   CO2 24 02/14/2020   Lab Results  Component Value Date   ALT 18 02/14/2020   AST 18 02/14/2020   ALKPHOS 59 01/27/2014   BILITOT 0.7 02/14/2020     RADIOGRAPHY: No results found.    IMPRESSION/PLAN: 1. 80 y.o. gentleman with Stage T1c adenocarcinoma of the prostate with Gleason Score of 3+4, and PSA of 7.7. We discussed the patient's workup and outlined the nature of prostate cancer in this setting. The patient's T stage, Gleason's score, and PSA put him into the favorable intermediate risk group. Accordingly, he is eligible for a variety of potential  treatment options including brachytherapy, 5.5 weeks of external radiation, or prostatectomy. We discussed the available radiation techniques, and focused on the details and logistics of delivery. We discussed and outlined the risks, benefits, short and long-term effects associated with radiotherapy and compared and contrasted these with prostatectomy. We discussed the role of SpaceOAR gel in reducing the rectal toxicity associated with radiotherapy. He appears to have a good understanding of his disease and our treatment recommendations which are of curative intent.  He was encouraged to ask questions that were answered to his stated satisfaction.  At the conclusion of our conversation, the patient is interested in moving forward with brachytherapy and use of SpaceOAR gel to reduce rectal toxicity from radiotherapy.  We will share our discussion with Dr. Abner Evans and move forward with scheduling his [facility] planning appointment in the near future.  The patient met briefly with Romie Jumper in our office who will be working closely with him to coordinate OR scheduling and pre and post procedure appointments.  We will contact the pharmaceutical rep to ensure that [place] is available at the time of procedure.  He has a scheduled follow up visit with Dr. Abner Evans on 04/10/20 to finalize his treatment decision. We enjoyed meeting him today and look forward to continuing to participate in his care.    Nicholos Johns, PA-C    Tyler Pita, MD  [facility] Direct Dial: [phone]  Fax: [phone] [email or web address]  Skype  [email or web address]   This document serves as a record of services personally performed by Tyler Pita, MD and Freeman Caldron, PA-C. It was created on their behalf by Wilburn Mylar, a trained medical scribe. The creation of this record is based on the scribe's personal observations and the provider's statements to them. This document has been checked and approved by the  attending provider.

## 2020-04-08 DIAGNOSIS — C61 Malignant neoplasm of prostate: Secondary | ICD-10-CM | POA: Insufficient documentation

## 2020-04-10 DIAGNOSIS — C61 Malignant neoplasm of prostate: Secondary | ICD-10-CM | POA: Diagnosis not present

## 2020-04-12 ENCOUNTER — Telehealth: Payer: Self-pay | Admitting: *Deleted

## 2020-04-12 NOTE — Telephone Encounter (Signed)
Called patient to update, spoke with patient. 

## 2020-04-17 ENCOUNTER — Telehealth: Payer: Self-pay | Admitting: *Deleted

## 2020-04-17 ENCOUNTER — Other Ambulatory Visit: Payer: Self-pay | Admitting: Urology

## 2020-04-17 DIAGNOSIS — C61 Malignant neoplasm of prostate: Secondary | ICD-10-CM

## 2020-04-17 NOTE — Telephone Encounter (Signed)
Called patient to inform of pre-seed appts. for 05-24-20 and his implant on 06-29-20, spoke with patient and he is aware of these appts.

## 2020-05-23 ENCOUNTER — Telehealth: Payer: Self-pay | Admitting: *Deleted

## 2020-05-23 NOTE — Telephone Encounter (Signed)
CALLED PATIENT TO REMIND OF PRE-SEED APPTS. FOR 05-24-20, SPOKE WITH PATIENT AND HE IS AWARE OF THESE APPTS.

## 2020-05-24 ENCOUNTER — Ambulatory Visit
Admission: RE | Admit: 2020-05-24 | Discharge: 2020-05-24 | Disposition: A | Discharge status: Home / Self Care | Payer: Medicare HMO | Source: Ambulatory Visit | Attending: Radiation Oncology | Admitting: Radiation Oncology

## 2020-05-24 ENCOUNTER — Ambulatory Visit
Admission: RE | Admit: 2020-05-24 | Discharge: 2020-05-24 | Disposition: A | Discharge status: Home / Self Care | Payer: Medicare HMO | Source: Ambulatory Visit | Attending: Urology | Admitting: Urology

## 2020-05-24 ENCOUNTER — Encounter (HOSPITAL_COMMUNITY)
Admission: RE | Admit: 2020-05-24 | Discharge: 2020-05-24 | Disposition: A | Discharge status: Home / Self Care | Payer: Medicare HMO | Source: Ambulatory Visit | Attending: Urology | Admitting: Urology

## 2020-05-24 ENCOUNTER — Ambulatory Visit (HOSPITAL_COMMUNITY)
Admission: RE | Admit: 2020-05-24 | Discharge: 2020-05-24 | Disposition: A | Discharge status: Home / Self Care | Payer: Medicare HMO | Source: Ambulatory Visit | Attending: Urology | Admitting: Urology

## 2020-05-24 ENCOUNTER — Encounter: Payer: Self-pay | Admitting: Medical Oncology

## 2020-05-24 ENCOUNTER — Other Ambulatory Visit: Payer: Self-pay

## 2020-05-24 DIAGNOSIS — C61 Malignant neoplasm of prostate: Secondary | ICD-10-CM

## 2020-05-24 DIAGNOSIS — Z01818 Encounter for other preprocedural examination: Secondary | ICD-10-CM | POA: Diagnosis not present

## 2020-05-24 NOTE — Progress Notes (Signed)
  Radiation Oncology         [phone]) [phone] ________________________________  Name: DEO MEHRINGER MRN: [id number]  Date: 05/24/2020  DOB: 09-13-1940  SIMULATION AND TREATMENT PLANNING NOTE PUBIC ARCH STUDY  [id number], [name], MD  Nolene Ebbs, MD  DIAGNOSIS: 80 y.o. gentleman with Stage T1c adenocarcinoma of the prostate with Gleason score of 3+4, and PSA of 7.7.  Oncology History  Malignant neoplasm of prostate ([place])  02/28/2020 Cancer Staging   Staging form: Prostate, AJCC 8th Edition - Clinical stage from 02/28/2020: Stage IIB (cT1c, cN0, cM0, PSA: 7.7, Grade Group: 2) - Signed by Freeman Caldron, PA-C on 04/08/2020 Histopathologic type: Adenocarcinoma, NOS Stage prefix: Initial diagnosis Prostate specific antigen (PSA) range: Less than 10 Gleason primary pattern: 3 Gleason secondary pattern: 4 Gleason score: 7 Histologic grading system: 5 grade system Number of biopsy cores examined: 12 Number of biopsy cores positive: 3 Location of positive needle core biopsies: One side   04/08/2020 Initial Diagnosis   Malignant neoplasm of prostate ([place])       ICD-10-CM   1. Malignant neoplasm of prostate ([place])  C61     COMPLEX SIMULATION:  The patient presented today for evaluation for possible prostate seed implant. He was brought to the radiation planning suite and placed supine on the CT couch. A 3-dimensional image study set was obtained in upload to the planning computer. There, on each axial slice, I contoured the prostate gland. Then, using three-dimensional radiation planning tools I reconstructed the prostate in view of the structures from the transperineal needle pathway to assess for possible pubic arch interference. In doing so, I did not appreciate any pubic arch interference. Also, the patient's prostate volume was estimated based on the drawn structure. The volume was 40 cc.  Given the pubic arch appearance and prostate volume, patient remains a good candidate to proceed with  prostate seed implant. Today, he freely provided informed written consent to proceed.    PLAN: The patient will undergo prostate seed implant.   ________________________________  Sheral Apley. Tammi Klippel, M.D.

## 2020-05-29 NOTE — Progress Notes (Signed)
Reviewed ekg done 05-24-2020 and ekg 01-26-2014 with dr Iona Beard rose mda, pt ekg from 05-24-2020 ok to use for 06-29-2020 surgery per dr Viviano Simas mda

## 2020-06-12 DIAGNOSIS — H401111 Primary open-angle glaucoma, right eye, mild stage: Secondary | ICD-10-CM | POA: Diagnosis not present

## 2020-06-12 DIAGNOSIS — H401123 Primary open-angle glaucoma, left eye, severe stage: Secondary | ICD-10-CM | POA: Diagnosis not present

## 2020-06-19 ENCOUNTER — Telehealth: Payer: Self-pay | Admitting: *Deleted

## 2020-06-19 DIAGNOSIS — C61 Malignant neoplasm of prostate: Secondary | ICD-10-CM | POA: Diagnosis not present

## 2020-06-19 NOTE — Telephone Encounter (Signed)
CALLED PATIENT TO REMIND OF LAB FOR 06-26-20, SPOKE WITH PATIENT AND HE IS AWARE OF THIS APPT.

## 2020-06-26 ENCOUNTER — Other Ambulatory Visit: Payer: Self-pay

## 2020-06-26 ENCOUNTER — Encounter (HOSPITAL_COMMUNITY)
Admission: RE | Admit: 2020-06-26 | Discharge: 2020-06-26 | Disposition: A | Discharge status: Home / Self Care | Payer: Medicare HMO | Source: Ambulatory Visit | Attending: Urology | Admitting: Urology

## 2020-06-26 ENCOUNTER — Encounter (HOSPITAL_BASED_OUTPATIENT_CLINIC_OR_DEPARTMENT_OTHER): Payer: Self-pay | Admitting: Urology

## 2020-06-26 DIAGNOSIS — Z01812 Encounter for preprocedural laboratory examination: Secondary | ICD-10-CM | POA: Diagnosis not present

## 2020-06-26 LAB — COMPREHENSIVE METABOLIC PANEL
ALT: 23 U/L (ref 0–44)
AST: 24 U/L (ref 15–41)
Albumin: 4.3 g/dL (ref 3.5–5.0)
Alkaline Phosphatase: 44 U/L (ref 38–126)
Anion gap: 7 (ref 5–15)
BUN: 16 mg/dL (ref 8–23)
CO2: 22 mmol/L (ref 22–32)
Calcium: 9.3 mg/dL (ref 8.9–10.3)
Chloride: 110 mmol/L (ref 98–111)
Creatinine, Ser: 1.22 mg/dL (ref 0.61–1.24)
GFR, Estimated: >60 mL/min (ref >60)
Glucose, Bld: 120 mg/dL — ABNORMAL HIGH (ref 70–99)
Potassium: 4.1 mmol/L (ref 3.5–5.1)
Sodium: 139 mmol/L (ref 135–145)
Total Bilirubin: 1 mg/dL (ref 0.3–1.2)
Total Protein: 7.5 g/dL (ref 6.5–8.1)

## 2020-06-26 LAB — APTT: aPTT: 30 seconds (ref 24–36)

## 2020-06-26 LAB — CBC
HCT: 48.9 % (ref 39.0–52.0)
Hemoglobin: 16.2 g/dL (ref 13.0–17.0)
MCH: 28.7 pg (ref 26.0–34.0)
MCHC: 33.1 g/dL (ref 30.0–36.0)
MCV: 86.7 fL (ref 80.0–100.0)
Platelets: 224 10*3/uL (ref 150–400)
RBC: 5.64 MIL/uL (ref 4.22–5.81)
RDW: 16.2 % — ABNORMAL HIGH (ref 11.5–15.5)
WBC: 6.3 10*3/uL (ref 4.0–10.5)
nRBC: 0 % (ref 0.0–0.2)

## 2020-06-26 LAB — PROTIME-INR
INR: 1 (ref 0.8–1.2)
Prothrombin Time: 12.9 seconds (ref 11.4–15.2)

## 2020-06-26 NOTE — Progress Notes (Signed)
Spoke w/ via phone for pre-op interview--- Pt Lab needs dos----  no             Lab results------ pt had lab done 06-26-2020, CBC, CMP, PT/PTT, results in epic;  current ekg/ cxr in epic/ chart COVID test -----patient states asymptomatic no test needed Arrive at ------- 1130 on 06-29-2020 NPO after MN NO Solid Food.  Clear liquids from MN until--- 1030 Med rec completed Medications to take morning of surgery ----- eye drops as usual Diabetic medication ----- n/a Patient instructed no nail polish to be worn day of surgery Patient instructed to bring photo id and insurance card day of surgery Patient aware to have Driver (ride ) / caregiver for 24 hours after surgery --- wife, Mardene Celeste Patient Special Instructions ----- will do one fleet enema morning of surgery Pre-Op special Istructions ----- n/a Patient verbalized understanding of instructions that were given at this phone interview. Patient denies shortness of breath, chest pain, fever, cough at this phone interview.

## 2020-06-28 ENCOUNTER — Telehealth: Payer: Self-pay | Admitting: *Deleted

## 2020-06-28 NOTE — Telephone Encounter (Signed)
Called patient to remind of procedure for 06-29-20, spoke with patient and he is aware of this procedure

## 2020-06-29 ENCOUNTER — Ambulatory Visit (HOSPITAL_COMMUNITY): Payer: Medicare HMO

## 2020-06-29 ENCOUNTER — Other Ambulatory Visit: Payer: Self-pay

## 2020-06-29 ENCOUNTER — Encounter (HOSPITAL_BASED_OUTPATIENT_CLINIC_OR_DEPARTMENT_OTHER)
Admission: RE | Disposition: A | Discharge status: Home / Self Care | Payer: Self-pay | Source: Ambulatory Visit | Attending: Urology

## 2020-06-29 ENCOUNTER — Ambulatory Visit (HOSPITAL_BASED_OUTPATIENT_CLINIC_OR_DEPARTMENT_OTHER): Payer: Medicare HMO | Admitting: Anesthesiology

## 2020-06-29 ENCOUNTER — Ambulatory Visit (HOSPITAL_BASED_OUTPATIENT_CLINIC_OR_DEPARTMENT_OTHER)
Admission: RE | Admit: 2020-06-29 | Discharge: 2020-06-29 | Disposition: A | Discharge status: Home / Self Care | Payer: Medicare HMO | Source: Ambulatory Visit | Attending: Urology | Admitting: Urology

## 2020-06-29 ENCOUNTER — Encounter (HOSPITAL_BASED_OUTPATIENT_CLINIC_OR_DEPARTMENT_OTHER): Payer: Self-pay | Admitting: Urology

## 2020-06-29 DIAGNOSIS — I10 Essential (primary) hypertension: Secondary | ICD-10-CM | POA: Diagnosis not present

## 2020-06-29 DIAGNOSIS — Z79899 Other long term (current) drug therapy: Secondary | ICD-10-CM | POA: Diagnosis not present

## 2020-06-29 DIAGNOSIS — Z7951 Long term (current) use of inhaled steroids: Secondary | ICD-10-CM | POA: Insufficient documentation

## 2020-06-29 DIAGNOSIS — C61 Malignant neoplasm of prostate: Secondary | ICD-10-CM | POA: Insufficient documentation

## 2020-06-29 DIAGNOSIS — E785 Hyperlipidemia, unspecified: Secondary | ICD-10-CM | POA: Diagnosis not present

## 2020-06-29 DIAGNOSIS — N179 Acute kidney failure, unspecified: Secondary | ICD-10-CM | POA: Diagnosis not present

## 2020-06-29 DIAGNOSIS — E7800 Pure hypercholesterolemia, unspecified: Secondary | ICD-10-CM | POA: Diagnosis not present

## 2020-06-29 DIAGNOSIS — J449 Chronic obstructive pulmonary disease, unspecified: Secondary | ICD-10-CM | POA: Diagnosis not present

## 2020-06-29 DIAGNOSIS — Z87891 Personal history of nicotine dependence: Secondary | ICD-10-CM | POA: Diagnosis not present

## 2020-06-29 HISTORY — PX: RADIOACTIVE SEED IMPLANT: SHX5150

## 2020-06-29 HISTORY — DX: Personal history of other diseases of the circulatory system: Z86.79

## 2020-06-29 HISTORY — DX: Unspecified glaucoma: H40.9

## 2020-06-29 HISTORY — DX: Unspecified osteoarthritis, unspecified site: M19.90

## 2020-06-29 HISTORY — PX: SPACE OAR INSTILLATION: SHX6769

## 2020-06-29 HISTORY — DX: Unspecified asthma, uncomplicated: J45.909

## 2020-06-29 HISTORY — DX: Hyperlipidemia, unspecified: E78.5

## 2020-06-29 HISTORY — DX: Chronic obstructive pulmonary disease, unspecified: J44.9

## 2020-06-29 HISTORY — DX: Nocturia: R35.1

## 2020-06-29 HISTORY — DX: Presence of spectacles and contact lenses: Z97.3

## 2020-06-29 HISTORY — PX: CYSTOSCOPY: SHX5120

## 2020-06-29 SURGERY — INSERTION, RADIATION SOURCE, PROSTATE
Anesthesia: General | Site: Prostate

## 2020-06-29 MED ORDER — DEXAMETHASONE SODIUM PHOSPHATE 10 MG/ML IJ SOLN
INTRAMUSCULAR | Status: AC
  Filled 2020-06-29: qty 1

## 2020-06-29 MED ORDER — EPHEDRINE SULFATE 50 MG/ML IJ SOLN
INTRAMUSCULAR | Status: DC | PRN
  Administered 2020-06-29: 20 mg via INTRAVENOUS
  Administered 2020-06-29: 10 mg via INTRAVENOUS
  Administered 2020-06-29: 20 mg via INTRAVENOUS

## 2020-06-29 MED ORDER — DEXAMETHASONE SODIUM PHOSPHATE 4 MG/ML IJ SOLN
INTRAMUSCULAR | Status: DC | PRN
  Administered 2020-06-29: 5 mg via INTRAVENOUS

## 2020-06-29 MED ORDER — ACETAMINOPHEN 160 MG/5ML PO SOLN
325.0000 mg | ORAL | Status: DC | PRN
Start: 2020-06-29 — End: 2020-06-29

## 2020-06-29 MED ORDER — LIDOCAINE HCL (CARDIAC) PF 100 MG/5ML IV SOSY
PREFILLED_SYRINGE | INTRAVENOUS | Status: DC | PRN
  Administered 2020-06-29: 60 mg via INTRAVENOUS

## 2020-06-29 MED ORDER — PROPOFOL 10 MG/ML IV BOLUS
INTRAVENOUS | Status: AC
  Filled 2020-06-29: qty 20

## 2020-06-29 MED ORDER — FLEET ENEMA 7-19 GM/118ML RE ENEM: 1.0000 | ENEMA | Freq: Once | RECTAL | Status: DC

## 2020-06-29 MED ORDER — DOCUSATE SODIUM 100 MG PO CAPS: 100.0000 mg | ORAL_CAPSULE | Freq: Every day | ORAL | 0 refills | Status: AC | PRN

## 2020-06-29 MED ORDER — CIPROFLOXACIN IN D5W 400 MG/200ML IV SOLN
400.0000 mg | INTRAVENOUS | Status: AC
  Administered 2020-06-29: 400 mg via INTRAVENOUS

## 2020-06-29 MED ORDER — STERILE WATER FOR IRRIGATION IR SOLN
Status: DC | PRN
  Administered 2020-06-29: 3 mL

## 2020-06-29 MED ORDER — EPHEDRINE 5 MG/ML INJ
INTRAVENOUS | Status: AC
  Filled 2020-06-29: qty 10

## 2020-06-29 MED ORDER — LACTATED RINGERS IV SOLN: INTRAVENOUS | Status: DC

## 2020-06-29 MED ORDER — IOHEXOL 300 MG/ML  SOLN
INTRAMUSCULAR | Status: DC | PRN
  Administered 2020-06-29: 7 mL

## 2020-06-29 MED ORDER — OXYCODONE HCL 5 MG PO TABS: 5.0000 mg | ORAL_TABLET | Freq: Once | ORAL | Status: DC | PRN

## 2020-06-29 MED ORDER — ONDANSETRON HCL 4 MG/2ML IJ SOLN: 4.0000 mg | Freq: Once | INTRAMUSCULAR | Status: DC | PRN

## 2020-06-29 MED ORDER — ONDANSETRON HCL 4 MG/2ML IJ SOLN
INTRAMUSCULAR | Status: DC | PRN
  Administered 2020-06-29: 4 mg via INTRAVENOUS

## 2020-06-29 MED ORDER — FENTANYL CITRATE (PF) 100 MCG/2ML IJ SOLN
INTRAMUSCULAR | Status: DC | PRN
  Administered 2020-06-29 (×4): 25 ug via INTRAVENOUS

## 2020-06-29 MED ORDER — ONDANSETRON HCL 4 MG/2ML IJ SOLN
INTRAMUSCULAR | Status: AC
  Filled 2020-06-29: qty 2

## 2020-06-29 MED ORDER — OXYCODONE HCL 5 MG/5ML PO SOLN: 5.0000 mg | Freq: Once | ORAL | Status: DC | PRN

## 2020-06-29 MED ORDER — SODIUM CHLORIDE 0.9 % IR SOLN
Status: DC | PRN
  Administered 2020-06-29: 200 mL via INTRAVESICAL

## 2020-06-29 MED ORDER — ACETAMINOPHEN 325 MG PO TABS
325.0000 mg | ORAL_TABLET | ORAL | Status: DC | PRN
Start: 2020-06-29 — End: 2020-06-29

## 2020-06-29 MED ORDER — OXYCODONE-ACETAMINOPHEN 5-325 MG PO TABS: 1.0000 | ORAL_TABLET | ORAL | 0 refills | Status: AC | PRN

## 2020-06-29 MED ORDER — FENTANYL CITRATE (PF) 100 MCG/2ML IJ SOLN: 25.0000 ug | INTRAMUSCULAR | Status: DC | PRN

## 2020-06-29 MED ORDER — SODIUM CHLORIDE (PF) 0.9 % IJ SOLN
INTRAMUSCULAR | Status: DC | PRN
  Administered 2020-06-29: 10 mL

## 2020-06-29 MED ORDER — CIPROFLOXACIN IN D5W 400 MG/200ML IV SOLN
INTRAVENOUS | Status: AC
  Filled 2020-06-29: qty 200

## 2020-06-29 MED ORDER — PROPOFOL 10 MG/ML IV BOLUS
INTRAVENOUS | Status: DC | PRN
  Administered 2020-06-29: 150 mg via INTRAVENOUS

## 2020-06-29 MED ORDER — FENTANYL CITRATE (PF) 100 MCG/2ML IJ SOLN
INTRAMUSCULAR | Status: AC
  Filled 2020-06-29: qty 2

## 2020-06-29 MED ORDER — CEPHALEXIN 500 MG PO CAPS: 500.0000 mg | ORAL_CAPSULE | Freq: Two times a day (BID) | ORAL | 0 refills | Status: AC

## 2020-06-29 MED ORDER — LIDOCAINE HCL (PF) 2 % IJ SOLN
INTRAMUSCULAR | Status: AC
  Filled 2020-06-29: qty 5

## 2020-06-29 MED ORDER — MEPERIDINE HCL 25 MG/ML IJ SOLN: 6.2500 mg | INTRAMUSCULAR | Status: DC | PRN

## 2020-06-29 SURGICAL SUPPLY — 37 items
BAG DRN RND TRDRP ANRFLXCHMBR (UROLOGICAL SUPPLIES) ×2
BAG URINE DRAIN 2000ML AR STRL (UROLOGICAL SUPPLIES) ×4 IMPLANT
BLADE CLIPPER SENSICLIP SURGIC (BLADE) ×4 IMPLANT
CATH FOLEY 2WAY SLVR  5CC 16FR (CATHETERS) ×4
CATH FOLEY 2WAY SLVR 5CC 16FR (CATHETERS) ×2 IMPLANT
CATH ROBINSON RED A/P 16FR (CATHETERS) IMPLANT
CATH ROBINSON RED A/P 20FR (CATHETERS) ×4 IMPLANT
CLOTH BEACON ORANGE TIMEOUT ST (SAFETY) ×4 IMPLANT
CNTNR URN SCR LID CUP LEK RST (MISCELLANEOUS) ×4 IMPLANT
CONT SPEC 4OZ STRL OR WHT (MISCELLANEOUS) ×8
COVER BACK TABLE 60X90IN (DRAPES) ×4 IMPLANT
COVER MAYO STAND STRL (DRAPES) ×4 IMPLANT
DRAPE C-ARM 35X43 STRL (DRAPES) ×2 IMPLANT
DRSG TEGADERM 4X4.75 (GAUZE/BANDAGES/DRESSINGS) ×4 IMPLANT
DRSG TEGADERM 8X12 (GAUZE/BANDAGES/DRESSINGS) ×4 IMPLANT
GAUZE SPONGE 4X4 12PLY STRL LF (GAUZE/BANDAGES/DRESSINGS) ×2 IMPLANT
GLOVE SURG ENC MOIS LTX SZ6 (GLOVE) IMPLANT
GLOVE SURG ENC MOIS LTX SZ6.5 (GLOVE) IMPLANT
GLOVE SURG ENC MOIS LTX SZ7 (GLOVE) ×4 IMPLANT
GLOVE SURG ENC MOIS LTX SZ8 (GLOVE) ×2 IMPLANT
GLOVE SURG ORTHO LTX SZ8.5 (GLOVE) ×8 IMPLANT
GLOVE SURG POLYISO LF SZ6.5 (GLOVE) IMPLANT
GOWN STRL REUS W/TWL LRG LVL3 (GOWN DISPOSABLE) ×4 IMPLANT
HOLDER FOLEY CATH W/STRAP (MISCELLANEOUS) IMPLANT
I-Seed AgX100 ×120 IMPLANT
IMPL SPACEOAR VUE SYSTEM (Spacer) IMPLANT
IMPLANT SPACEOAR VUE SYSTEM (Spacer) ×4 IMPLANT
IV NS 1000ML (IV SOLUTION) ×4
IV NS 1000ML BAXH (IV SOLUTION) ×2 IMPLANT
KIT TURNOVER CYSTO (KITS) ×4 IMPLANT
MARKER SKIN DUAL TIP RULER LAB (MISCELLANEOUS) ×4 IMPLANT
PACK CYSTO (CUSTOM PROCEDURE TRAY) ×4 IMPLANT
SUT BONE WAX W31G (SUTURE) IMPLANT
SYR 10ML LL (SYRINGE) ×6 IMPLANT
TOWEL OR 17X26 10 PK STRL BLUE (TOWEL DISPOSABLE) ×4 IMPLANT
UNDERPAD 30X36 HEAVY ABSORB (UNDERPADS AND DIAPERS) ×8 IMPLANT
WATER STERILE IRR 500ML POUR (IV SOLUTION) ×4 IMPLANT

## 2020-06-29 NOTE — Anesthesia Procedure Notes (Signed)
Procedure Name: LMA Insertion Date/Time: 06/29/2020 1:38 PM Performed by: Justice Rocher, CRNA Pre-anesthesia Checklist: Patient identified, Emergency Drugs available, Suction available, Patient being monitored and Timeout performed Patient Re-evaluated:Patient Re-evaluated prior to induction Oxygen Delivery Method: Circle system utilized Preoxygenation: Pre-oxygenation with 100% oxygen Induction Type: IV induction Ventilation: Mask ventilation without difficulty LMA: LMA inserted LMA Size: 4.0 Number of attempts: 1 Airway Equipment and Method: Bite block Placement Confirmation: breath sounds checked- equal and bilateral, CO2 detector and positive ETCO2 Tube secured with: Tape Dental Injury: Teeth and Oropharynx as per pre-operative assessment

## 2020-06-29 NOTE — Anesthesia Preprocedure Evaluation (Signed)
Anesthesia Evaluation  Patient identified by MRN, date of birth, ID band Patient awake    Reviewed: Allergy & Precautions, H&P , NPO status , Patient's Chart, lab work & pertinent test results, reviewed documented beta blocker date and time   Airway Mallampati: II  TM Distance: >3 FB Neck ROM: full    Dental no notable dental hx.    Pulmonary asthma , COPD, former smoker,    Pulmonary exam normal breath sounds clear to auscultation       Cardiovascular Exercise Tolerance: Good hypertension,  Rhythm:regular Rate:Normal     Neuro/Psych negative neurological ROS  negative psych ROS   GI/Hepatic negative GI ROS, Neg liver ROS,   Endo/Other  negative endocrine ROS  Renal/GU Renal disease  negative genitourinary   Musculoskeletal  (+) Arthritis , Osteoarthritis,    Abdominal   Peds  Hematology negative hematology ROS (+)   Anesthesia Other Findings   Reproductive/Obstetrics negative OB ROS                             Anesthesia Physical Anesthesia Plan  ASA: 3  Anesthesia Plan: General   Post-op Pain Management:    Induction: Intravenous  PONV Risk Score and Plan: 2 and Ondansetron and Dexamethasone  Airway Management Planned: LMA  Additional Equipment: None  Intra-op Plan:   Post-operative Plan:   Informed Consent: I have reviewed the patients History and Physical, chart, labs and discussed the procedure including the risks, benefits and alternatives for the proposed anesthesia with the patient or authorized representative who has indicated his/her understanding and acceptance.     Dental Advisory Given  Plan Discussed with: CRNA  Anesthesia Plan Comments: ( )        Anesthesia Quick Evaluation

## 2020-06-29 NOTE — Transfer of Care (Signed)
Immediate Anesthesia Transfer of Care Note  Patient: Timothy Evans  Procedure(s) Performed: Procedure(s) (LRB): RADIOACTIVE SEED IMPLANT/BRACHYTHERAPY IMPLANT (N/A) SPACE OAR INSTILLATION (N/A) CYSTOSCOPY FLEXIBLE (N/A)  Patient Location: PACU  Anesthesia Type: General  Level of Consciousness: awake, sedated, patient cooperative and responds to stimulation  Airway & Oxygen Therapy: Patient Spontanous Breathing and Patient connected to [place] 02 and soft FM   Post-op Assessment: Report given to PACU RN, Post -op Vital signs reviewed and stable and Patient moving all extremities  Post vital signs: Reviewed and stable  Complications: No apparent anesthesia complications

## 2020-06-29 NOTE — Discharge Instructions (Addendum)
Activity:  You are encouraged to ambulate frequently (about every hour during waking hours) to help prevent blood clots from forming in your legs or lungs.    Diet: You should advance your diet as instructed by your physician.  It will be normal to have some bloating, nausea, and abdominal discomfort intermittently.  Prescriptions:  You will be provided a prescription for pain medication to take as needed.  If your pain is not severe enough to require the prescription pain medication, you may take extra strength Tylenol instead which will have less side effects.  You should also take a prescribed stool softener to avoid straining with bowel movements as the prescription pain medication may constipate you.  What to call us about: You should call the office [phone]) if you develop fever > 101 or develop persistent vomiting. Activity:  You are encouraged to ambulate frequently (about every hour during waking hours) to help prevent blood clots from forming in your legs or lungs.     PROSTATE CANCER TREATMENT WITH RADIOACTIVE IODINE-125 SEED IMPLANT  This instruction sheet is intended to discuss implantation of Iodine-125 seeds as treatment for cancer of the prostate. It will explain in detail what you may expect from this treatment and what precautions are necessary as a result of the treatment. Iodine-125 emits a relatively low energy radiation. The radioactive seeds are surgically implanted directly into the prostate gland. Most of the radiation is contained within the prostate gland. A very small amount is present outside the body.The precautions that we ask you to take are to ensure that those around you are protected from unnecessary radiation. The principles of radiation safety that you need to understand are:  DISTANCE: The further a person is from the radioactive implant the less radiation they will be receiving. The amount of radiation received falls off quite rapidly with distance. More  specific guidelines are given in the table on the last page.  TIME: The amount of radiation a person is exposed to is directly proportional to the amount of time that is spent in close proximity to the radioactive implant. Very little radiation will be received during short periods. See the table on the last page for more specific guideline.  CHILDREN UNDER AGE 64 Children should not be allowed to sit on your lap or otherwise be in very close contact for more than a few minutes for the first 6-8 weeks following the implant. You may affectionately greet (hug/kiss) a child for a short period of time, but remember, the longer you are in close proximity with that child the more radiation they are being exposed to. At a distance of 6 feet there is no limit to the length of time you may spend together. See specific guidelines on the last page.  PREGNANT OR POSSIBLY PREGNANT WOMEN Pregnant women should avoid prolonged close physical contact with you for the first 6-8 weeks after implant. At a distance of 6 feet there is no limit to the length of time you may spend together. Pregnant women or possibly pregnant women can safely be in close contact with you for a limited period of time. See the last page for guidelines.  FAMILY RELATIONS You may sleep in the same bed as your partner (provided she is not pregnant or under the age of 53). Sexual intercourse, using a condom, may be resumed 2 weeks after the implant. Your semen may be discolored, dark brown or black. This is normal and is the result of bleeding that may  have occurred during the implant. After 3-4 weeks it will not be necessary to use a condom.  DAILY ACTIVITIES You may resume normal activities in a few days (example: work, shopping, church) without the risk of harmful radiation exposure to those around you provided you keep in mind the time and distance precautions. Objects that you touch or item that you use do not become radioactive. Linens,  clothing, tableware, and dishes may be used by other persons without special precautions. Your bodily wastes (urine and stool) are not radioactive.  SPECIAL PRECAUTIONS It is possible to lose implanted Iodine-125 seed(s) through urination. Although it is possible to pass seeds indefinitely, it is most likely to occur immediately after catheter removal. To prevent this from happening the catheter that was in place during the implant procedure is removed immediately after the implant and a cystoscopy procedure is performed. The process of removing the catheter and the cystoscopy procedure should dislodge and remove any seeds that are not firmly imbedded in the prostate tissue. However, you should watch for seeds if/when you remove your catheter at home. The seeds are silver colored and the size of a grain of rice. In the unlikely event that a seed is seen after urination, simply flush the seed down the toilet. The seed should not be handled with your fingers, not even with a glove or napkin. A spoon or tweezers can be used to pick up a seed. The Radiation Oncology department is open Monday - Friday from 8:00 am to 5:30 pm with a Radiation Oncologist on call at all times. He or she may be reached by calling [phone]. If you are to be hospitalized or if death should occur, your family should notify the Runner, broadcasting/film/video.  SIDE EFFECTS There are very few side effects associate with the implant procedure. Minor burning with urination, weak stream, hesitancy, intermittency, frequency, mild pain or feeling unable to pass your urine freely are common and usually stop in one to four months. If these symptoms are extremely uncomfortable, contact your physician.  RADIATION SAFETY GUIDELINES PROSTATE CANCER TREATMENT WITH RADIOACTIVE IODINE-125 SEED IMPLANT  The following guidelines will limit exposure to less than naturally occurring background radiation.  PERSONS AGE 65-45 (if able to become  pregnant)  FOR 8 WEEKS FOLLOWING IMPLANT  At a distance of 1 foot: limit time to less than 2 hours/week At a distance of 3 feet: limit time to 20 hours/week At a distance of 6 feet: no restrictions  AFTER 8 WEEKS No restrictions  CHILDREN UNDER AGE 65, PREGNANT WOMEN OR POSSIBLY PREGNANT WOMEN  FOR 8 WEEKS FOLLOWING IMPLANT At a distance of 1 foot: limit time to 10 minutes/week At a distance of 3 feet: limit time to 2 hours/week At a distance of 6 feet: no restrictions  AFTER 8 WEEKS No restrictions  PERSONS OVER THE AGE OF 45 AND DO NOT EXPECT TO HAVE ANY MORE CHILDREN No restrictions  Updated by [facility] in January 2020    Post Anesthesia Home Care Instructions  Activity: Get plenty of rest for the remainder of the day. A responsible individual must stay with you for 24 hours following the procedure.  For the next 24 hours, DO NOT: -Drive a car -Paediatric nurse -Drink alcoholic beverages -Take any medication unless instructed by your physician -Make any legal decisions or sign important papers.  Meals: Start with liquid foods such as gelatin or soup. Progress to regular foods as tolerated. Avoid greasy, spicy, heavy foods. If nausea and/or vomiting  occur, drink only clear liquids until the nausea and/or vomiting subsides. Call your physician if vomiting continues.  Special Instructions/Symptoms: Your throat may feel dry or sore from the anesthesia or the breathing tube placed in your throat during surgery. If this causes discomfort, gargle with warm salt water. The discomfort should disappear within 24 hours.

## 2020-06-29 NOTE — Anesthesia Postprocedure Evaluation (Signed)
Anesthesia Post Note  Patient: Timothy Evans  Procedure(s) Performed: RADIOACTIVE SEED IMPLANT/BRACHYTHERAPY IMPLANT (Prostate) SPACE OAR INSTILLATION (Prostate) CYSTOSCOPY FLEXIBLE (Bladder)     Patient location during evaluation: PACU Anesthesia Type: General Level of consciousness: awake and alert Pain management: pain level controlled Vital Signs Assessment: post-procedure vital signs reviewed and stable Respiratory status: spontaneous breathing, nonlabored ventilation, respiratory function stable and patient connected to nasal cannula oxygen Cardiovascular status: blood pressure returned to baseline and stable Postop Assessment: no apparent nausea or vomiting Anesthetic complications: no   No notable events documented.  Last Vitals:  Vitals:   06/29/20 1530 06/29/20 1556  BP: 137/62 (!) 104/93  Pulse: 90 80  Resp: 10 12  Temp:    SpO2: 96% 100%    Last Pain:  Vitals:   06/29/20 1556  TempSrc:   PainSc: 0-No pain                 Effie Berkshire

## 2020-06-29 NOTE — H&P (Signed)
Office Visit Report     06/19/2020   --------------------------------------------------------------------------------   Timothy Evans MRN: [id number] DOB: 06-08-1940, 80 year old Male SSN:    PRIMARY CARE:  Haywood Pao, MD REFERRING:  [name], MD PROVIDER:  Rexene Alberts, M.D. TREATING:  Mcarthur Rossetti, [place] LOCATION:  [facility] [phone]     --------------------------------------------------------------------------------   CC/HPI: Pt presents today for pre-operative history and physical exam in anticipation of TRUS, brachytherapy, space oar placement, and fluoro by Dr. Abner Greenspan on 06/29/20. He is doing well and is without complaint.   Pt denies F/C, HA, CP, SOB, N/V, diarrhea/constipation, back pain, flank pain, hematuria, and dysuria.     HX:   Timothy Evans is a 80 year old male follow-up to discuss his diagnosis of prostate cancer and definitive treatment options.   Patient underwent prostate biopsy on 02/28/2020 for an elevated PSA of 7.7 ng/mL on 11/01/2019. 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 Denies new or worsening bone or back pain. Good appetite and stable weight.   Family history: Denies Imaging studies:   PMH: Hypertension, hyperlipidemia PSH: Bilateral cataract surgery, neck surgery   TNM stage: Clinical stage T1 cNX MX PSA: 7.7 NG/mL on 11/01/2019 Gleason score: GS 3+4 = 7 in 2/12 cores and GS 3+3 = 6 in 1/12 cores Biopsy: 02/28/2020 Left: No prostate cancer, 2 cores atypical Right: GS 3+4 = 7 at the right base and right lateral base and GS 3+3 = 6 at the right apex Prostate volume: 40 g PSAD: 0.19   Nomogram CSS (5 year, 10 year): 99%, 99% PFS (5 year, 10 year): 86%, 77% EPE: 38% LNI: 3% SVI: 3%   IPSS: 12, Q OL 6 SHIM: He did not complete this   He has met with Dr. Tammi Klippel  and has elected proceed with brachytherapy.     ALLERGIES: Nkda     MEDICATIONS: Sildenafil Citrate 20 mg tablet 1-2 tablet PO PRN Amlodipine Besylate 5 mg tablet Atorvastatin Calcium Losartan Potassium 25 mg tablet Trelegy Ellipta     GU PSH: Prostate Needle Biopsy - 02/28/2020       NON-GU PSH: Cataract surgery, Bilateral Neck Surgery (Unspecified) Surgical Pathology, Gross And Microscopic Examination For Prostate Needle - 02/28/2020       GU PMH: Prostate Cancer - 04/10/2020, - 03/08/2020 Acute Cystitis/UTI - 03/08/2020 Elevated PSA - 02/28/2020, - 01/10/2020, After discussing the options he has elected to proceed with close monitoring of his PSA so I will recheck his PSA again in 3 months with PSA and DRE in 6 months. 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 He said he was not married at the time and did not get to use the medication but he is now married and would like to pursue treatment., - 12/14/2018 Prostate nodule w/ LUTS - 01/10/2020     NON-GU PMH: Hypercholesterolemia Hypertension     FAMILY HISTORY: 1 Daughter - Daughter 1 son - Son   SOCIAL HISTORY: Marital Status: Married Preferred Language: English; Ethnicity: Not Hispanic Or Latino; Race: Black or African American Current Smoking  Status: Patient does not smoke anymore.   Tobacco Use Assessment Completed: Used Tobacco in last 30 days? Does not use smokeless tobacco. Social Drinker. Does not use drugs. Drinks 2 caffeinated drinks per day. Has not had a blood transfusion.     Notes: ETOH 6 pack beer per day   REVIEW OF SYSTEMS:    GU Review Male:   Patient denies frequent urination, hard to postpone urination, burning/ pain with urination, get up at night to  urinate, leakage of urine, stream starts and stops, trouble starting your stream, have to strain to urinate , erection problems, and penile pain. Gastrointestinal (Upper):   Patient denies nausea, vomiting, and indigestion/ heartburn. Gastrointestinal (Lower):   Patient denies diarrhea and constipation. Constitutional:   Patient denies fever, night sweats, weight loss, and fatigue. Skin:   Patient denies skin rash/ lesion and itching. Eyes:   Patient denies blurred vision and double vision. Ears/ Nose/ Throat:   Patient denies sore throat and sinus problems. Hematologic/Lymphatic:   Patient denies swollen glands and easy bruising. Cardiovascular:   Patient denies leg swelling and chest pains. Respiratory:   Patient denies cough and shortness of breath. Endocrine:   Patient denies excessive thirst. Musculoskeletal:   Patient denies back pain and joint pain. Neurological:   Patient denies headaches and dizziness. Psychologic:   Patient denies depression and anxiety.   VITAL SIGNS:      06/19/2020 03:43 PM Weight 160 lb / 72.57 kg Height 69 in / 175.26 cm BP 123/68 mmHg Pulse 79 /min Temperature 97.7 F / 36.5 C BMI 23.6 kg/m   MULTI-SYSTEM PHYSICAL EXAMINATION:    Constitutional: Well-nourished. No physical deformities. Normally developed. Good grooming. Neck: Neck symmetrical, not swollen. Normal tracheal position. Respiratory: Normal breath sounds. No labored breathing, no use of accessory muscles.   Cardiovascular: Regular rate and rhythm. No murmur, no gallop.   Lymphatic: No enlargement of neck, axillae, groin. Skin: No paleness, no jaundice, no cyanosis. No lesion, no ulcer, no rash. Neurologic / Psychiatric: Oriented to time, oriented to place, oriented to person. No depression, no anxiety, no agitation. Gastrointestinal: No mass, no tenderness, no rigidity, non obese abdomen. Eyes: Normal conjunctivae. Normal eyelids. Ears, Nose, Mouth, and Throat: Left ear no scars, no  lesions, no masses. Right ear no scars, no lesions, no masses. Nose no scars, no lesions, no masses. Normal hearing. Normal lips. Musculoskeletal: Normal gait and station of head and neck.     Complexity of Data: Records Review:   Previous Patient Records Urine Test Review:   Urinalysis  06/19/20 Urinalysis Urine Appearance Clear   Urine Color Yellow   Urine Glucose Neg mg/dL Urine Bilirubin Neg mg/dL Urine Ketones Neg mg/dL Urine Specific Gravity 1.020   Urine Blood Neg ery/uL Urine pH <=5.0   Urine Protein Trace mg/dL Urine Urobilinogen 0.2 mg/dL Urine Nitrites Neg   Urine Leukocyte Esterase Neg leu/uL   PROCEDURES:           Urinalysis - 81003 Dipstick Dipstick Cont'd Color: Yellow Bilirubin: Neg mg/dL Appearance: Clear Ketones: Neg mg/dL Specific Gravity: 1.020 Blood: Neg ery/uL pH: <=5.0 Protein: Trace mg/dL Glucose: Neg mg/dL Urobilinogen: 0.2 mg/dL   Nitrites: Neg   Leukocyte Esterase: Neg leu/uL     ASSESSMENT:     ICD-10 Details 1 GU:   Prostate Cancer - C61     PLAN:            Schedule Return Visit/Planned Activity: Keep Scheduled Appointment - Schedule Surgery  Document Letter(s):  Created for Patient: Clinical Summary          Notes:   There are no changes in the patients history or physical exam since last evaluation by Dr. Abner Greenspan. Pt is scheduled to undergo TRUS, brachytherapy, space oar placement, and fluoro on 06/29/20.   All pt's questions were answered to the best of my ability.          Next Appointment:      Next Appointment: 06/29/2020 01:00 PM   Appointment Type: Surgery     Location: [facility] [phone]   Provider: Rexene Alberts, M.D.   Reason for Visit: NE/OP BRACHYTHERAPY , SPACE OAR        Signed by Mcarthur Rossetti, PA on 06/19/20 at 3:55 PM (EDT)   Urology Preoperative H&P    Chief Complaint: Prostate cancer   History of Present Illness: Timothy Evans is a 80 y.o. male with prostate  cancer here for brachytherapy.           Past Medical History:  Diagnosis Date   COPD (chronic obstructive pulmonary disease) (HCC)     Glaucoma, both eyes     History of hypertension     Hyperlipidemia     Mild asthma     Nocturia     OA (osteoarthritis)     Prostate cancer [facility])      urologist-- dr Kenzie Flakes---  dx 02/ 2022 Gleason 3+4   Wears glasses             Past Surgical History:  Procedure Laterality Date   CATARACT EXTRACTION W/ INTRAOCULAR LENS IMPLANT Bilateral      early 2000s   [facility]--- for cervical spine nerve impingement from [facility]   PROSTATE BIOPSY          Allergies: No Known Allergies        Family History  Problem Relation Age of Onset   Diabetes Mellitus II Neg Hx     Prostate cancer Neg Hx     Colon cancer Neg Hx     Breast cancer Neg Hx     Pancreatic cancer Neg Hx        Social History:  reports that he quit smoking about 15 years ago. His smoking use included cigarettes. He has a 10.00 pack-year smoking history. He has never used smokeless tobacco. He reports previous alcohol use. He reports that he does not use drugs.   ROS: A complete review of systems was performed.  All systems are negative except for pertinent findings as noted.   Physical Exam:  Vital signs in last 24 hours: Temp:  [99 F (37.2 C)] 99 F (37.2 C) (06/24 1102) Pulse Rate:  [88] 88 (06/24 1102) Resp:  [24] 24 (06/24 1102) BP: (125)/(80) 125/80 (06/24 1102) SpO2:  [97 %] 97 % (06/24 1102) Weight:  [76 kg] 76 kg (06/24 1102) Constitutional:  Alert and oriented, No acute distress Cardiovascular: Regular rate and rhythm Respiratory: Normal respiratory effort, Lungs clear bilaterally GI: Abdomen is soft, nontender, nondistended, no abdominal masses GU: No CVA tenderness Lymphatic: No lymphadenopathy Neurologic: Grossly intact, no focal deficits Psychiatric: Normal mood and affect   Laboratory Data:  Recent Labs (last 2 labs)   No results  for input(s): WBC, HGB, HCT, PLT in the last 72 hours.      Recent Labs (last 2 labs)   No results for input(s): NA, K, CL,  GLUCOSE, BUN, CALCIUM, CREATININE in the last 72 hours.   Invalid input(s): CO3       Lab Results Last 24 Hours  No results found for this or any previous visit (from the past 24 hour(s)).   No results found for this or any previous visit (from the past 240 hour(s)).   Renal Function:    Recent Labs    06/26/20 1037  CREATININE 1.22    Estimated Creatinine Clearance: 49.9 mL/min (by C-G formula based on SCr of 1.22 mg/dL).   Radiologic Imaging: Imaging Results (Last 48 hours)  No results found.     I independently reviewed the above imaging studies.   Assessment and Plan RAMADAN COUEY is a 80 y.o. male with prostate cancer here for brachytherapy.     Brachytherapy/space OAR consent- The patient was counseled about the natural history of prostate cancer and the standard treatment options that are available for prostate cancer. It was explained to him how his age and life expectancy, clinical stage, Gleason score, and PSA affect his prognosis, the decision to proceed with additional staging studies, as well as how that information influences recommended treatment strategies. We discussed the roles for active surveillance, radiation therapy, surgical therapy, androgen deprivation, as well as ablative therapy options for the treatment of prostate cancer as appropriate to his individual cancer situation. We discussed the risks and benefits of these options with regard to their impact on cancer control and also in terms of potential adverse events, complications, and impact on quality of life particularly related to urinary and sexual function. The patient was encouraged to ask questions throughout the discussion today and all questions were answered to his stated satisfaction. In addition, the patient was provided with and/or directed to appropriate resources and  literature for further education about prostate cancer and treatment options.        Matt R. Blaike Vickers MD 06/29/2020, 12:44 PM  [facility] Pager: [phone]
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
## 2020-06-29 NOTE — Op Note (Signed)
PATIENT:  Timothy Evans  PRE-OPERATIVE DIAGNOSIS:  Adenocarcinoma of the prostate  POST-OPERATIVE DIAGNOSIS:  Same  PROCEDURE:  1. I-125 radioactive seed implantation 2. Cystoscopy  3. Placement of SpaceOAR  SURGEON:  Rexene Alberts, MD  Radiation oncologist: Tyler Pita, MD  ANESTHESIA:  General  EBL:  Minimal  DRAINS: None  INDICATION: Timothy Evans  Description of procedure: After informed consent the patient was brought to the major OR, placed on the table and administered general anesthesia. He was then moved to the modified lithotomy position with his perineum perpendicular to the floor. His perineum and genitalia were then sterilely prepped. An official timeout was then performed. A 16 French Foley catheter was then placed in the bladder and filled with dilute contrast, a rectal tube was placed in the rectum and the transrectal ultrasound probe was placed in the rectum and affixed to the stand. He was then sterilely draped.  Real time ultrasonography was used along with the seed planning software. This was used to develop the seed plan including the number of needles as well as number of seeds required for complete and adequate coverage. Real-time ultrasonography was then used along with the previously developed plan and the Nucletron device to implant a total of 60 seeds using 18 needles. This proceeded without difficulty or complication.   I then proceeded with placement of SpaceOAR by introducing a needle with the bevel angled inferiorly approximately 2 cm superior to the anus. This was angled downward and under direct ultrasound was placed within the space between the prostatic capsule and rectum. This was confirmed with a small amount of sterile saline injected and this was performed under direct ultrasound. I then attached the SpaceOAR to the needle and injected this in the space between the prostate and rectum with good placement noted.  A Foley catheter was then removed  as well as the transrectal ultrasound probe and rectal probe. Flexible cystoscopy was then performed using the 16 French flexible scope which revealed a normal urethra throughout its length down to the sphincter which appeared intact. The prostatic urethra revealed bilobar hypertrophy but no evidence of obstruction, seeds, spacers or lesions. The bladder was then entered and fully and systematically inspected. The ureteral orifices were noted to be of normal configuration and position. The mucosa revealed no evidence of tumors. There were also no stones identified within the bladder. I noted no seeds or spacers on the floor of the bladder and retroflexion of the scope revealed no seeds protruding from the base of the prostate.  The cystoscope was then removed and the patient was awakened and taken to recovery room in stable and satisfactory condition. He tolerated procedure well and there were no intraoperative complications.  [facility]  Pager: [phone]

## 2020-07-02 ENCOUNTER — Encounter (HOSPITAL_BASED_OUTPATIENT_CLINIC_OR_DEPARTMENT_OTHER): Payer: Self-pay | Admitting: Urology

## 2020-07-02 NOTE — Progress Notes (Signed)
  Radiation Oncology         [phone] ________________________________  Name: Timothy Evans MRN: [id number]  Date: 07/02/2020  DOB: [birth year]       Prostate Seed Implant  HE:NIDPOEU, Timothy Grief, MD  No ref. provider found  DIAGNOSIS: 80 y.o. gentleman with Stage T1c adenocarcinoma of the prostate with Gleason score of 3+4, and PSA of 7.7.  Oncology History  Malignant neoplasm of prostate ([place])  02/28/2020 Cancer Staging   Staging form: Prostate, AJCC 8th Edition - Clinical stage from 02/28/2020: Stage IIB (cT1c, cN0, cM0, PSA: 7.7, Grade Group: 2) - Signed by Timothy Caldron, PA-C on 04/08/2020  Histopathologic type: Adenocarcinoma, NOS  Stage prefix: Initial diagnosis  Prostate specific antigen (PSA) range: Less than 10  Gleason primary pattern: 3  Gleason secondary pattern: 4  Gleason score: 7  Histologic grading system: 5 grade system  Number of biopsy cores examined: 12  Number of biopsy cores positive: 3  Location of positive needle core biopsies: One side    04/08/2020 Initial Diagnosis   Malignant neoplasm of prostate ([place])        ICD-10-CM   1. Malignant neoplasm of prostate [facility])  C61 Discharge patient      PROCEDURE: Insertion of radioactive I-125 seeds into the prostate gland.  RADIATION DOSE: 145 Gy, definitive therapy.  TECHNIQUE: Timothy Evans was brought to the operating room with the urologist. He was placed in the dorsolithotomy position. He was catheterized and a rectal tube was inserted. The perineum was shaved, prepped and draped. The ultrasound probe was then introduced into the rectum to see the prostate gland.  TREATMENT DEVICE: A needle grid was attached to the ultrasound probe stand and anchor needles were placed.  3D PLANNING: The prostate was imaged in 3D using a sagittal sweep of the prostate probe. These images were transferred to the planning computer. There, the prostate, urethra and rectum were defined on each axial reconstructed  image. Then, the software created an optimized 3D plan and a few seed positions were adjusted. The quality of the plan was reviewed using [facility] information for the target and the following two organs at risk:  Urethra and Rectum.  Then the accepted plan was printed and handed off to the radiation therapist.  Under my supervision, the custom loading of the seeds and spacers was carried out and loaded into sealed vicryl sleeves.  These pre-loaded needles were then placed into the needle holder.  PROSTATE VOLUME STUDY:  Using transrectal ultrasound the volume of the prostate was verified to be 37 cc.  SPECIAL TREATMENT PROCEDURE/SUPERVISION AND HANDLING: The pre-loaded needles were then delivered under sagittal guidance. A total of 18 needles were used to deposit 60 seeds in the prostate gland. The individual seed activity was 0.484 mCi.  SpaceOAR:  Yes  COMPLEX SIMULATION: At the end of the procedure, an anterior radiograph of the pelvis was obtained to document seed positioning and count. Cystoscopy was performed to check the urethra and bladder.  MICRODOSIMETRY: At the end of the procedure, the patient was emitting 0.192 mR/hr at 1 meter. Accordingly, he was considered safe for hospital discharge.  PLAN: The patient will return to the radiation oncology clinic for post implant CT dosimetry in three weeks.   ________________________________  Timothy Evans, M.D.

## 2020-07-06 ENCOUNTER — Other Ambulatory Visit: Payer: Self-pay

## 2020-07-06 ENCOUNTER — Ambulatory Visit: Payer: Medicare HMO | Attending: Internal Medicine

## 2020-07-06 DIAGNOSIS — Z23 Encounter for immunization: Secondary | ICD-10-CM

## 2020-07-06 NOTE — Progress Notes (Signed)
   Covid-19 Vaccination Clinic  Name:  Timothy Evans    MRN: [id number] DOB: 11-17-1940  07/06/2020  [name] was observed post Covid-19 immunization for 15 minutes without incident. He was provided with Vaccine Information Sheet and instruction to access the V-Safe system.   [name] was instructed to call 911 with any severe reactions post vaccine: Difficulty breathing  Swelling of face and throat  A fast heartbeat  A bad rash all over body  Dizziness and weakness   Immunizations Administered     Name Date Dose VIS Date Route   PFIZER Comrnaty(Gray TOP) Covid-19 Vaccine 07/06/2020  2:00 PM 0.3 mL 12/15/2019 Intramuscular   Manufacturer: [place]   Lot: [id number]   [place]: [phone]

## 2020-07-25 ENCOUNTER — Telehealth: Payer: Self-pay | Admitting: *Deleted

## 2020-07-25 NOTE — Telephone Encounter (Signed)
CALLED PATIENT TO REMIND OF POST SEED APPTS. FOR 07-26-20, SPOKE WITH PATIENT AND HE STATED THAT HE COULD NOT DO THESE APPTS. DUE TO HIS WORK SCHEDULE, CANCELLED APPTS. AND NOTIFIED [facility]UNING FOR NEW DATES, PATIENT VERIFIED UNDERSTANDING THIS

## 2020-07-25 NOTE — Telephone Encounter (Signed)
CALLED PATIENT TO ASK ABOUT COMING FOR POST SEED APPTS. ON 08-03-20, PATIENT AGREED TO DO SO.

## 2020-07-26 ENCOUNTER — Ambulatory Visit: Payer: Medicare HMO | Admitting: Urology

## 2020-07-26 ENCOUNTER — Ambulatory Visit: Payer: Medicare HMO | Admitting: Radiation Oncology

## 2020-08-02 ENCOUNTER — Telehealth: Payer: Self-pay | Admitting: *Deleted

## 2020-08-02 NOTE — Progress Notes (Signed)
Radiation Oncology         [phone] ________________________________  Name: Timothy Evans MRN: [id number]  Date: 08/03/2020  DOB: 12/31/1940  Post-Seed Follow-Up Visit Note  CC: Nolene Ebbs, MD  Nolene Ebbs, MD  Diagnosis:   80 y.o. gentleman with Stage T1c adenocarcinoma of the prostate with Gleason score of 3+4, and PSA of 7.7.    ICD-10-CM   1. Malignant neoplasm of prostate (HCC)  C61       Interval Since Last Radiation:  5 weeks 06/29/20:  Insertion of radioactive I-125 seeds into the prostate gland; 145 Gy, definitive therapy with placement of SpaceOAR gel.  Narrative:  The patient returns today for routine follow-up.  He is complaining of increased urinary frequency and urinary hesitation symptoms. He filled out a questionnaire regarding urinary function today providing and overall IPSS score of 2 characterizing his symptoms as mild on Flomax daily.  He specifically denies dysuria, hematuria, straining to void, incomplete bladder emptying or incontinence.  His pre-implant score was 10. He denies any abdominal pain or bowel symptoms.  He recently saw Jiles Crocker, NP and started Flomax 07/13/20 which has helped with his flow of stream.  He denies any significant change in his energy level and overall, is quite pleased with his progress to date.  ALLERGIES:  has No Known Allergies.  Meds: Current Outpatient Medications  Medication Sig Dispense Refill   dorzolamide-timolol (COSOPT) 22.3-6.8 MG/ML ophthalmic solution Place 1 drop into both eyes 2 (two) times daily.     oxyCODONE-acetaminophen (PERCOCET) 5-325 MG tablet Take 1 tablet by mouth every 4 (four) hours as needed for up to 20 doses for severe pain. 20 tablet 0   TRELEGY ELLIPTA 100-62.5-25 MCG/INH AEPB daily as needed.     No current facility-administered medications for this visit.    Physical Findings: In general this is a well appearing African American male in no acute distress. He's alert and oriented x4  and appropriate throughout the examination. Cardiopulmonary assessment is negative for acute distress and he exhibits normal effort.   Lab Findings: Lab Results  Component Value Date   WBC 6.3 06/26/2020   HGB 16.2 06/26/2020   HCT 48.9 06/26/2020   MCV 86.7 06/26/2020   PLT 224 06/26/2020    Radiographic Findings:  Patient underwent CT imaging in our clinic for post implant dosimetry. The CT will be reviewed by Dr. Tammi Klippel to confirm there is an adequate distribution of radioactive seeds throughout the prostate gland and ensure that there are no seeds in or near the rectum. We suspect the final radiation plan and dosimetry will show appropriate coverage of the prostate gland. He understands that we will call and inform him of any unexpected findings on further review of his imaging and dosimetry.  Impression/Plan: 80 y.o. gentleman with Stage T1c adenocarcinoma of the prostate with Gleason score of 3+4, and PSA of 7.7. The patient is recovering from the effects of radiation. His urinary symptoms should gradually improve over the next 4-6 months. We talked about this today. He is encouraged by his improvement already and is otherwise pleased with his outcome. We also talked about long-term follow-up for prostate cancer following seed implant. He understands that ongoing PSA determinations and digital rectal exams will help perform surveillance to rule out disease recurrence. He saw Jiles Crocker, NP on 07/13/20 and has a follow up appointment scheduled with Dr. Abner Greenspan on 08/28/20. He will continue taking Flomax as prescribed. He understands what to expect with his PSA  measures. Patient was also educated today about some of the long-term effects from radiation including a small risk for rectal bleeding and possibly erectile dysfunction. We talked about some of the general management approaches to these potential complications. However, I did encourage the patient to contact our office or return at any point  if he has questions or concerns related to his previous radiation and prostate cancer.    Nicholos Johns, PA-C

## 2020-08-02 NOTE — Progress Notes (Signed)
  Radiation Oncology         [phone]) [phone] ________________________________  Name: Timothy Evans MRN: [id number]  Date: 08/03/2020  DOB: 03/21/1940  COMPLEX SIMULATION NOTE  NARRATIVE:  The patient was brought to the [facility] today following prostate seed implantation approximately one month ago.  Identity was confirmed.  All relevant records and images related to the planned course of therapy were reviewed.  Then, the patient was set-up supine.  CT images were obtained.  The CT images were loaded into the planning software.  Then the prostate and rectum were contoured.  Treatment planning then occurred.  The implanted iodine 125 seeds were identified by the physics staff for projection of radiation distribution  I have requested : 3D Simulation  I have requested a DVH of the following structures: Prostate and rectum.    ________________________________  Sheral Apley Tammi Klippel, M.D.

## 2020-08-02 NOTE — Telephone Encounter (Signed)
CALLED PATIENT TO REMIND OF POST SEED APPTS. FOR 08-03-20, LVM FOR A RETURN CALL

## 2020-08-03 ENCOUNTER — Encounter: Payer: Self-pay | Admitting: Urology

## 2020-08-03 ENCOUNTER — Ambulatory Visit
Admission: RE | Admit: 2020-08-03 | Discharge: 2020-08-03 | Disposition: A | Discharge status: Home / Self Care | Payer: Medicare HMO | Source: Ambulatory Visit | Attending: Urology | Admitting: Urology

## 2020-08-03 ENCOUNTER — Other Ambulatory Visit: Payer: Self-pay

## 2020-08-03 ENCOUNTER — Ambulatory Visit
Admission: RE | Admit: 2020-08-03 | Discharge: 2020-08-03 | Disposition: A | Discharge status: Home / Self Care | Payer: Medicare HMO | Source: Ambulatory Visit | Attending: Radiation Oncology | Admitting: Radiation Oncology

## 2020-08-03 VITALS — BP 128/71 | HR 77 | Temp 97.0°F | Resp 18 | Ht 69.0 in | Wt 174.5 lb

## 2020-08-03 DIAGNOSIS — C61 Malignant neoplasm of prostate: Secondary | ICD-10-CM | POA: Diagnosis not present

## 2020-08-03 NOTE — Progress Notes (Signed)
AUA score is 1. Patient denies any pain at this time. Urology appointment is in August he believes it is the 15th. No issues with urination or bowels.

## 2020-08-20 ENCOUNTER — Encounter: Payer: Self-pay | Admitting: Radiation Oncology

## 2020-08-20 ENCOUNTER — Ambulatory Visit
Admission: RE | Admit: 2020-08-20 | Discharge: 2020-08-20 | Disposition: A | Discharge status: Home / Self Care | Payer: Medicare HMO | Source: Ambulatory Visit | Attending: Radiation Oncology | Admitting: Radiation Oncology

## 2020-08-20 DIAGNOSIS — C61 Malignant neoplasm of prostate: Secondary | ICD-10-CM | POA: Diagnosis not present

## 2020-08-20 DIAGNOSIS — Z510 Encounter for antineoplastic radiation therapy: Secondary | ICD-10-CM | POA: Insufficient documentation

## 2020-08-27 NOTE — Progress Notes (Signed)
  Radiation Oncology         [phone]) [phone] ________________________________  Name: Timothy Evans MRN: [id number]  Date: 08/20/2020  DOB: 08-Dec-1940  3D Planning Note   Prostate Brachytherapy Post-Implant Dosimetry  Diagnosis: 80 y.o. gentleman with Stage T1c adenocarcinoma of the prostate with Gleason score of 3+4, and PSA of 7.7  Narrative: On a previous date, Timothy Evans returned following prostate seed implantation for post implant planning. He underwent CT scan complex simulation to delineate the three-dimensional structures of the pelvis and demonstrate the radiation distribution.  Since that time, the seed localization, and complex isodose planning with dose volume histograms have now been completed.  Results:   Prostate Coverage - The dose of radiation delivered to the 90% or more of the prostate gland (D90) was 115.1% of the prescription dose. This exceeds our goal of greater than 90%. Rectal Sparing - The volume of rectal tissue receiving the prescription dose or higher was 0.0 cc. This falls under our thresholds tolerance of 1.0 cc.  Impression: The prostate seed implant appears to show adequate target coverage and appropriate rectal sparing.  Plan:  The patient will continue to follow with urology for ongoing PSA determinations. I would anticipate a high likelihood for local tumor control with minimal risk for rectal morbidity.  ________________________________  Sheral Apley Tammi Klippel, M.D.

## 2020-08-28 DIAGNOSIS — R3912 Poor urinary stream: Secondary | ICD-10-CM | POA: Diagnosis not present

## 2020-10-02 DIAGNOSIS — C61 Malignant neoplasm of prostate: Secondary | ICD-10-CM | POA: Diagnosis not present

## 2020-10-02 DIAGNOSIS — E7849 Other hyperlipidemia: Secondary | ICD-10-CM | POA: Diagnosis not present

## 2020-10-02 DIAGNOSIS — R252 Cramp and spasm: Secondary | ICD-10-CM | POA: Diagnosis not present

## 2020-10-02 DIAGNOSIS — M179 Osteoarthritis of knee, unspecified: Secondary | ICD-10-CM | POA: Diagnosis not present

## 2020-10-02 DIAGNOSIS — Z23 Encounter for immunization: Secondary | ICD-10-CM | POA: Diagnosis not present

## 2021-01-15 ENCOUNTER — Other Ambulatory Visit: Payer: Self-pay | Admitting: Internal Medicine

## 2021-01-16 LAB — CBC
HCT: 47.5 % (ref 38.5–50.0)
Hemoglobin: 15.6 g/dL (ref 13.2–17.1)
MCH: 29.1 pg (ref 27.0–33.0)
MCHC: 32.8 g/dL (ref 32.0–36.0)
MCV: 88.6 fL (ref 80.0–100.0)
MPV: 10.8 fL (ref 7.5–12.5)
Platelets: 229 10*3/uL (ref 140–400)
RBC: 5.36 10*6/uL (ref 4.20–5.80)
RDW: 14.3 % (ref 11.0–15.0)
WBC: 6.9 10*3/uL (ref 3.8–10.8)

## 2021-01-16 LAB — COMPLETE METABOLIC PANEL WITH GFR
AG Ratio: 1.6 (calc) (ref 1.0–2.5)
ALT: 22 U/L (ref 9–46)
AST: 25 U/L (ref 10–35)
Albumin: 4.4 g/dL (ref 3.6–5.1)
Alkaline phosphatase (APISO): 46 U/L (ref 35–144)
BUN: 18 mg/dL (ref 7–25)
CO2: 24 mmol/L (ref 20–32)
Calcium: 9.6 mg/dL (ref 8.6–10.3)
Chloride: 106 mmol/L (ref 98–110)
Creat: 1.11 mg/dL (ref 0.70–1.28)
Globulin: 2.7 g/dL (calc) (ref 1.9–3.7)
Glucose, Bld: 87 mg/dL (ref 65–99)
Potassium: 4.1 mmol/L (ref 3.5–5.3)
Sodium: 141 mmol/L (ref 135–146)
Total Bilirubin: 1.2 mg/dL (ref 0.2–1.2)
Total Protein: 7.1 g/dL (ref 6.1–8.1)
eGFR: 68 mL/min/{1.73_m2} (ref >=60)

## 2021-01-16 LAB — LIPID PANEL
Cholesterol: 228 mg/dL — ABNORMAL HIGH (ref <200)
HDL: 57 mg/dL (ref >=40)
LDL Cholesterol (Calc): 144 mg/dL (calc) — ABNORMAL HIGH
Non-HDL Cholesterol (Calc): 171 mg/dL (calc) — ABNORMAL HIGH (ref <130)
Total CHOL/HDL Ratio: 4 (calc) (ref <5.0)
Triglycerides: 142 mg/dL (ref <150)

## 2021-01-16 LAB — TSH: TSH: 0.98 mIU/L (ref 0.40–4.50)

## 2021-09-10 ENCOUNTER — Other Ambulatory Visit: Payer: Self-pay | Admitting: Internal Medicine

## 2021-09-12 LAB — SARS-COV-2 RNA,(COVID-19) QUALITATIVE NAAT: SARS CoV2 RNA: NOT DETECTED

## 2023-02-24 ENCOUNTER — Other Ambulatory Visit (INDEPENDENT_AMBULATORY_CARE_PROVIDER_SITE_OTHER): Payer: Self-pay

## 2023-02-24 ENCOUNTER — Ambulatory Visit (INDEPENDENT_AMBULATORY_CARE_PROVIDER_SITE_OTHER): Payer: Medicare HMO | Admitting: Physician Assistant

## 2023-02-24 DIAGNOSIS — G8929 Other chronic pain: Secondary | ICD-10-CM

## 2023-02-24 DIAGNOSIS — M5442 Lumbago with sciatica, left side: Secondary | ICD-10-CM

## 2023-02-24 DIAGNOSIS — M5441 Lumbago with sciatica, right side: Secondary | ICD-10-CM

## 2023-02-24 MED ORDER — METHOCARBAMOL 750 MG PO TABS: 750.0000 mg | ORAL_TABLET | Freq: Two times a day (BID) | ORAL | 0 refills | Status: DC | PRN

## 2023-02-24 MED ORDER — HYDROCODONE-ACETAMINOPHEN 5-325 MG PO TABS: 1.0000 | ORAL_TABLET | Freq: Every day | ORAL | 0 refills | Status: DC | PRN

## 2023-02-24 MED ORDER — PREDNISONE 5 MG (21) PO TBPK: ORAL_TABLET | ORAL | 2 refills | Status: DC
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
## 2023-03-16 ENCOUNTER — Encounter: Payer: Self-pay | Admitting: Physical Therapy

## 2023-03-16 ENCOUNTER — Ambulatory Visit: Payer: Medicare HMO | Admitting: Physical Therapy

## 2023-03-16 DIAGNOSIS — M5459 Other low back pain: Secondary | ICD-10-CM | POA: Diagnosis not present

## 2023-03-16 DIAGNOSIS — M6281 Muscle weakness (generalized): Secondary | ICD-10-CM
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
## 2023-08-10 ENCOUNTER — Emergency Department (HOSPITAL_COMMUNITY)
Admission: EM | Admit: 2023-08-10 | Discharge: 2023-08-11 | Disposition: A | Discharge status: Home / Self Care | Attending: Emergency Medicine | Admitting: Emergency Medicine

## 2023-08-10 ENCOUNTER — Other Ambulatory Visit: Payer: Self-pay

## 2023-08-10 ENCOUNTER — Emergency Department (HOSPITAL_COMMUNITY)

## 2023-08-10 ENCOUNTER — Encounter (HOSPITAL_COMMUNITY): Payer: Self-pay

## 2023-08-10 DIAGNOSIS — R000 Tachycardia, unspecified: Secondary | ICD-10-CM | POA: Insufficient documentation

## 2023-08-10 DIAGNOSIS — J45909 Unspecified asthma, uncomplicated: Secondary | ICD-10-CM | POA: Diagnosis not present

## 2023-08-10 DIAGNOSIS — R1084 Generalized abdominal pain: Secondary | ICD-10-CM | POA: Diagnosis not present

## 2023-08-10 DIAGNOSIS — I10 Essential (primary) hypertension: Secondary | ICD-10-CM | POA: Insufficient documentation

## 2023-08-10 DIAGNOSIS — Z8546 Personal history of malignant neoplasm of prostate: Secondary | ICD-10-CM | POA: Insufficient documentation

## 2023-08-10 DIAGNOSIS — J449 Chronic obstructive pulmonary disease, unspecified: Secondary | ICD-10-CM | POA: Diagnosis not present

## 2023-08-10 DIAGNOSIS — R0602 Shortness of breath: Secondary | ICD-10-CM | POA: Diagnosis present

## 2023-08-10 DIAGNOSIS — T782XXA Anaphylactic shock, unspecified, initial encounter: Secondary | ICD-10-CM | POA: Insufficient documentation

## 2023-08-10 LAB — COMPREHENSIVE METABOLIC PANEL WITH GFR
ALT: 18 U/L (ref 0–44)
AST: 29 U/L (ref 15–41)
Albumin: 3.9 g/dL (ref 3.5–5.0)
Alkaline Phosphatase: 49 U/L (ref 38–126)
Anion gap: 12 (ref 5–15)
BUN: 15 mg/dL (ref 8–23)
CO2: 19 mmol/L — ABNORMAL LOW (ref 22–32)
Calcium: 9.1 mg/dL (ref 8.9–10.3)
Chloride: 109 mmol/L (ref 98–111)
Creatinine, Ser: 1.06 mg/dL (ref 0.61–1.24)
GFR, Estimated: >60 mL/min (ref >60)
Glucose, Bld: 168 mg/dL — ABNORMAL HIGH (ref 70–99)
Potassium: 4.3 mmol/L (ref 3.5–5.1)
Sodium: 140 mmol/L (ref 135–145)
Total Bilirubin: 1.1 mg/dL (ref 0.0–1.2)
Total Protein: 7.1 g/dL (ref 6.5–8.1)

## 2023-08-10 LAB — TROPONIN I (HIGH SENSITIVITY)
Troponin I (High Sensitivity): 7 ng/L (ref <18)
Troponin I (High Sensitivity): 6 ng/L (ref <18)

## 2023-08-10 LAB — CBC WITH DIFFERENTIAL/PLATELET
Abs Immature Granulocytes: 0.06 K/uL (ref 0.00–0.07)
Basophils Absolute: 0 K/uL (ref 0.0–0.1)
Basophils Relative: 0 %
Eosinophils Absolute: 0.1 K/uL (ref 0.0–0.5)
Eosinophils Relative: 0 %
HCT: 45 % (ref 39.0–52.0)
Hemoglobin: 14.7 g/dL (ref 13.0–17.0)
Immature Granulocytes: 1 %
Lymphocytes Relative: 13 %
Lymphs Abs: 1.6 K/uL (ref 0.7–4.0)
MCH: 29.3 pg (ref 26.0–34.0)
MCHC: 32.7 g/dL (ref 30.0–36.0)
MCV: 89.6 fL (ref 80.0–100.0)
Monocytes Absolute: 0.7 K/uL (ref 0.1–1.0)
Monocytes Relative: 5 %
Neutro Abs: 10.2 K/uL — ABNORMAL HIGH (ref 1.7–7.7)
Neutrophils Relative %: 81 %
Platelets: 171 K/uL (ref 150–400)
RBC: 5.02 MIL/uL (ref 4.22–5.81)
RDW: 15.8 % — ABNORMAL HIGH (ref 11.5–15.5)
WBC: 12.6 K/uL — ABNORMAL HIGH (ref 4.0–10.5)
nRBC: 0 % (ref 0.0–0.2)

## 2023-08-10 LAB — LIPASE, BLOOD: Lipase: 31 U/L (ref 11–51)

## 2023-08-10 LAB — CBG MONITORING, ED: Glucose-Capillary: 173 mg/dL — ABNORMAL HIGH (ref 70–99)

## 2023-08-10 MED ORDER — PREDNISONE 10 MG PO TABS: 20.0000 mg | ORAL_TABLET | Freq: Every day | ORAL | 0 refills | Status: DC

## 2023-08-10 MED ORDER — PREDNISONE 10 MG PO TABS: 20.0000 mg | ORAL_TABLET | Freq: Every day | ORAL | 0 refills | Status: AC

## 2023-08-10 MED ORDER — METHYLPREDNISOLONE SODIUM SUCC 125 MG IJ SOLR
125.0000 mg | Freq: Once | INTRAMUSCULAR | Status: AC
  Administered 2023-08-10: 125 mg via INTRAVENOUS
  Filled 2023-08-10: qty 2

## 2023-08-10 MED ORDER — DIPHENHYDRAMINE HCL 50 MG/ML IJ SOLN
50.0000 mg | Freq: Once | INTRAMUSCULAR | Status: AC
  Administered 2023-08-10: 50 mg via INTRAVENOUS
  Filled 2023-08-10: qty 1

## 2023-08-10 MED ORDER — IOHEXOL 350 MG/ML SOLN
75.0000 mL | Freq: Once | INTRAVENOUS | Status: AC | PRN
  Administered 2023-08-10: 75 mL via INTRAVENOUS

## 2023-08-10 MED ORDER — FAMOTIDINE IN NACL 20-0.9 MG/50ML-% IV SOLN
20.0000 mg | Freq: Once | INTRAVENOUS | Status: AC
  Administered 2023-08-10: 20 mg via INTRAVENOUS
  Filled 2023-08-10: qty 50

## 2023-08-10 MED ORDER — MORPHINE SULFATE (PF) 4 MG/ML IV SOLN
4.0000 mg | Freq: Once | INTRAVENOUS | Status: AC
  Administered 2023-08-10: 4 mg via INTRAVENOUS
  Filled 2023-08-10: qty 1

## 2023-08-10 MED ORDER — EPINEPHRINE 0.3 MG/0.3ML IJ SOAJ: 0.3000 mg | INTRAMUSCULAR | 0 refills | Status: AC | PRN

## 2023-08-10 MED ORDER — DICYCLOMINE HCL 10 MG/ML IM SOLN
20.0000 mg | Freq: Once | INTRAMUSCULAR | Status: AC
  Administered 2023-08-10: 20 mg via INTRAMUSCULAR
  Filled 2023-08-10: qty 2

## 2023-08-10 MED ORDER — EPINEPHRINE 0.3 MG/0.3ML IJ SOAJ: 0.3000 mg | INTRAMUSCULAR | 0 refills | Status: DC | PRN

## 2023-08-10 MED ORDER — METOCLOPRAMIDE HCL 5 MG/ML IJ SOLN
10.0000 mg | Freq: Once | INTRAMUSCULAR | Status: AC
  Administered 2023-08-10: 10 mg via INTRAVENOUS
  Filled 2023-08-10: qty 2
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
## 2023-08-10 NOTE — ED Notes (Signed)
 EDP notified of patient arrival to ER.
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
## 2023-08-10 NOTE — Discharge Instructions (Addendum)
 You were seen today for anaphylaxis. While you were here we monitored your vitals, preformed a physical exam, and labs and CT. These were all reassuring and there is no indication for any further testing or intervention in the emergency department at this time.   Things to do:  - Follow up with your primary care provider within the next 1-2 weeks - You have a small aortic aneurysm that will require follow-up.  Please see your PCP regarding continued monitoring. Please be sure to keep your EpiPen  on you at all times in case this were to happen again. Please take prednisone  once daily for the next 3 days.  And take Benadryl  if you have recurrence of your symptoms.  Return to the emergency department if you have any new or worsening symptoms including difficulty breathing, worsening nausea or vomiting, or if you have any other concerns.

## 2023-08-13 ENCOUNTER — Other Ambulatory Visit (INDEPENDENT_AMBULATORY_CARE_PROVIDER_SITE_OTHER): Payer: Self-pay

## 2023-08-13 ENCOUNTER — Ambulatory Visit: Admitting: Physician Assistant

## 2023-08-13 ENCOUNTER — Encounter: Payer: Self-pay | Admitting: Physician Assistant

## 2023-08-13 DIAGNOSIS — G8929 Other chronic pain: Secondary | ICD-10-CM | POA: Diagnosis not present

## 2023-08-13 DIAGNOSIS — M545 Low back pain, unspecified: Secondary | ICD-10-CM

## 2023-08-13 MED ORDER — METHOCARBAMOL 750 MG PO TABS: 750.0000 mg | ORAL_TABLET | Freq: Two times a day (BID) | ORAL | 0 refills | Status: AC | PRN

## 2023-08-13 MED ORDER — PREDNISONE 10 MG (21) PO TBPK: ORAL_TABLET | ORAL | 0 refills | Status: AC

## 2023-08-13 MED ORDER — HYDROCODONE-ACETAMINOPHEN 5-325 MG PO TABS: 1.0000 | ORAL_TABLET | Freq: Every day | ORAL | 0 refills | Status: AC | PRN
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
## 2023-11-09 ENCOUNTER — Encounter: Payer: Self-pay | Admitting: Radiology
